# Patient Record
Sex: Male | Born: 1965 | Race: Black or African American | Hispanic: No | Smoking: Current every day smoker
Health system: Southern US, Community
[De-identification: ages and names within clinical notes are randomized; demographics above are authoritative.]

## PROBLEM LIST (undated history)

## (undated) DIAGNOSIS — I10 Essential (primary) hypertension: Secondary | ICD-10-CM

---

## 2013-11-27 ENCOUNTER — Ambulatory Visit: Payer: Self-pay | Attending: Internal Medicine

## 2013-12-02 ENCOUNTER — Encounter: Payer: Self-pay | Admitting: Internal Medicine

## 2013-12-02 ENCOUNTER — Other Ambulatory Visit: Payer: Self-pay | Admitting: Emergency Medicine

## 2013-12-02 ENCOUNTER — Ambulatory Visit: Payer: Self-pay | Attending: Internal Medicine | Admitting: Internal Medicine

## 2013-12-02 VITALS — BP 168/106 | HR 71

## 2013-12-02 DIAGNOSIS — D369 Benign neoplasm, unspecified site: Secondary | ICD-10-CM

## 2013-12-02 DIAGNOSIS — I10 Essential (primary) hypertension: Secondary | ICD-10-CM

## 2013-12-02 DIAGNOSIS — I160 Hypertensive urgency: Secondary | ICD-10-CM

## 2013-12-02 DIAGNOSIS — K029 Dental caries, unspecified: Secondary | ICD-10-CM | POA: Insufficient documentation

## 2013-12-02 DIAGNOSIS — K047 Periapical abscess without sinus: Secondary | ICD-10-CM | POA: Insufficient documentation

## 2013-12-02 DIAGNOSIS — K044 Acute apical periodontitis of pulpal origin: Secondary | ICD-10-CM

## 2013-12-02 LAB — LIPID PANEL
Cholesterol: 188 mg/dL (ref 0–200)
HDL: 31 mg/dL — ABNORMAL LOW (ref >39)
LDL CALC: 117 mg/dL — AB (ref 0–99)
Total CHOL/HDL Ratio: 6.1 Ratio
Triglycerides: 199 mg/dL — ABNORMAL HIGH (ref <150)
VLDL: 40 mg/dL (ref 0–40)

## 2013-12-02 LAB — CBC WITH DIFFERENTIAL/PLATELET
Basophils Absolute: 0 10*3/uL (ref 0.0–0.1)
Basophils Relative: 0 % (ref 0–1)
EOS PCT: 4 % (ref 0–5)
Eosinophils Absolute: 0.3 10*3/uL (ref 0.0–0.7)
HCT: 42.1 % (ref 39.0–52.0)
HEMOGLOBIN: 14.4 g/dL (ref 13.0–17.0)
LYMPHS ABS: 2.7 10*3/uL (ref 0.7–4.0)
LYMPHS PCT: 39 % (ref 12–46)
MCH: 28.7 pg (ref 26.0–34.0)
MCHC: 34.2 g/dL (ref 30.0–36.0)
MCV: 84 fL (ref 78.0–100.0)
Monocytes Absolute: 0.3 10*3/uL (ref 0.1–1.0)
Monocytes Relative: 5 % (ref 3–12)
Neutro Abs: 3.5 10*3/uL (ref 1.7–7.7)
Neutrophils Relative %: 52 % (ref 43–77)
PLATELETS: 286 10*3/uL (ref 150–400)
RBC: 5.01 MIL/uL (ref 4.22–5.81)
RDW: 14.2 % (ref 11.5–15.5)
WBC: 6.8 10*3/uL (ref 4.0–10.5)

## 2013-12-02 LAB — COMPLETE METABOLIC PANEL WITH GFR
ALT: 45 U/L (ref 0–53)
AST: 30 U/L (ref 0–37)
Albumin: 3.9 g/dL (ref 3.5–5.2)
Alkaline Phosphatase: 77 U/L (ref 39–117)
BUN: 10 mg/dL (ref 6–23)
CALCIUM: 9.4 mg/dL (ref 8.4–10.5)
CHLORIDE: 105 meq/L (ref 96–112)
CO2: 25 mEq/L (ref 19–32)
Creat: 0.81 mg/dL (ref 0.50–1.35)
GFR, Est African American: >89 mL/min
Glucose, Bld: 85 mg/dL (ref 70–99)
Potassium: 4 mEq/L (ref 3.5–5.3)
SODIUM: 139 meq/L (ref 135–145)
Total Bilirubin: 0.3 mg/dL (ref 0.2–1.2)
Total Protein: 7.5 g/dL (ref 6.0–8.3)

## 2013-12-02 MED ORDER — CLINDAMYCIN HCL 300 MG PO CAPS: 300.0000 mg | ORAL_CAPSULE | Freq: Three times a day (TID) | ORAL | Status: DC

## 2013-12-02 MED ORDER — CLONIDINE HCL 0.1 MG PO TABS
0.1000 mg | ORAL_TABLET | Freq: Once | ORAL | Status: AC
  Administered 2013-12-02: 0.1 mg via ORAL

## 2013-12-02 MED ORDER — CLONIDINE HCL 0.1 MG PO TABS: 0.1000 mg | ORAL_TABLET | Freq: Once | ORAL | Status: DC

## 2013-12-02 MED ORDER — IBUPROFEN 600 MG PO TABS: 600.0000 mg | ORAL_TABLET | Freq: Three times a day (TID) | ORAL | Status: DC | PRN

## 2013-12-02 MED ORDER — HYDROCHLOROTHIAZIDE 25 MG PO TABS: 25.0000 mg | ORAL_TABLET | Freq: Every day | ORAL | Status: DC

## 2013-12-02 NOTE — Patient Instructions (Signed)
Dental Caries  Dental caries (also called tooth decay) is the most common oral disease. It can occur at any age, but is more common in children and young adults.  HOW DENTAL CARIES DEVELOPS  The process of decay begins when bacteria and foods (particularly sugars and starches) combine in your mouth to produce plaque. Plaque is a substance that sticks to the hard, outer surface of a tooth (enamel). The bacteria in plaque produce acids that attack enamel. These acids may also attack the root surface of a tooth (cementum) if it is exposed. Repeated attacks dissolve these surfaces and create holes in the tooth (cavities). If left untreated, the acids destroy the other layers of the tooth.  RISK FACTORS  Frequent sipping of sugary beverages.   Frequent snacking on sugary and starchy foods, especially those that easily get stuck in the teeth.   Poor oral hygiene.   Dry mouth.   Substance abuse such as methamphetamine abuse.   Broken or poor-fitting dental restorations.   Eating disorders.   Gastroesophageal reflux disease (GERD).   Certain radiation treatments to the head and neck. SYMPTOMS In the early stages of dental caries, symptoms are seldom present. Sometimes white, chalky areas may be seen on the enamel or other tooth layers. In later stages, symptoms may include:  Pits and holes on the enamel.  Toothache after sweet, hot, or cold foods or drinks are consumed.  Pain around the tooth.  Swelling around the tooth. DIAGNOSIS  Most of the time, dental caries is detected during a regular dental checkup. A diagnosis is made after a thorough medical and dental history is taken and the surfaces of your teeth are checked for signs of dental caries. Sometimes special instruments, such as lasers, are used to check for dental caries. Dental X-ray exams may be taken so that areas not visible to the eye (such as between the contact areas of the teeth) can be checked for cavities.    TREATMENT  If dental caries is in its early stages, it may be reversed with a fluoride treatment or an application of a remineralizing agent at the dental office. Thorough brushing and flossing at home is needed to aid these treatments. If it is in its later stages, treatment depends on the location and extent of tooth destruction:   If a small area of the tooth has been destroyed, the destroyed area will be removed and cavities will be filled with a material such as gold, silver amalgam, or composite resin.   If a large area of the tooth has been destroyed, the destroyed area will be removed and a cap (crown) will be fitted over the remaining tooth structure.   If the center part of the tooth (pulp) is affected, a procedure called a root canal will be needed before a filling or crown can be placed.   If most of the tooth has been destroyed, the tooth may need to be pulled (extracted). HOME CARE INSTRUCTIONS You can prevent, stop, or reverse dental caries at home by practicing good oral hygiene. Good oral hygiene includes:  Thoroughly cleaning your teeth at least twice a day with a toothbrush and dental floss.   Using a fluoride toothpaste. A fluoride mouth rinse may also be used if recommended by your dentist or health care provider.   Restricting the amount of sugary and starchy foods and sugary liquids you consume.   Avoiding frequent snacking on these foods and sipping of these liquids.   Keeping regular visits  with a dentist for checkups and cleanings. PREVENTION   Practice good oral hygiene.  Consider a dental sealant. A dental sealant is a coating material that is applied by your dentist to the pits and grooves of teeth. The sealant prevents food from being trapped in them. It may protect the teeth for several years.  Ask about fluoride supplements if you live in a community without fluorinated water or with water that has a low fluoride content. Use fluoride supplements  as directed by your dentist or health care provider.  Allow fluoride varnish applications to teeth if directed by your dentist or health care provider. Document Released: 06/17/2002 Document Revised: 05/28/2013 Document Reviewed: 09/27/2012 [facility] Patient Information 2014 [place]. Hypertension As your heart beats, it forces blood through your arteries. This force is your blood pressure. If the pressure is too high, it is called hypertension (HTN) or high blood pressure. HTN is dangerous because you may have it and not know it. High blood pressure may mean that your heart has to work harder to pump blood. Your arteries may be narrow or stiff. The extra work puts you at risk for heart disease, stroke, and other problems.  Blood pressure consists of two numbers, a higher number over a lower, 110/72, for example. It is stated as "110 over 72." The ideal is below 120 for the top number (systolic) and under 80 for the bottom (diastolic). Write down your blood pressure today. You should pay close attention to your blood pressure if you have certain conditions such as:  Heart failure.  Prior heart attack.  Diabetes  Chronic kidney disease.  Prior stroke.  Multiple risk factors for heart disease. To see if you have HTN, your blood pressure should be measured while you are seated with your arm held at the level of the heart. It should be measured at least twice. A one-time elevated blood pressure reading (especially in the Emergency Department) does not mean that you need treatment. There may be conditions in which the blood pressure is different between your right and left arms. It is important to see your caregiver soon for a recheck. Most people have essential hypertension which means that there is not a specific cause. This type of high blood pressure may be lowered by changing lifestyle factors such as:  Stress.  Smoking.  Lack of exercise.  Excessive  weight.  Drug/tobacco/alcohol use.  Eating less salt. Most people do not have symptoms from high blood pressure until it has caused damage to the body. Effective treatment can often prevent, delay or reduce that damage. TREATMENT  When a cause has been identified, treatment for high blood pressure is directed at the cause. There are a large number of medications to treat HTN. These fall into several categories, and your caregiver will help you select the medicines that are best for you. Medications may have side effects. You should review side effects with your caregiver. If your blood pressure stays high after you have made lifestyle changes or started on medicines,   Your medication(s) may need to be changed.  Other problems may need to be addressed.  Be certain you understand your prescriptions, and know how and when to take your medicine.  Be sure to follow up with your caregiver within the time frame advised (usually within two weeks) to have your blood pressure rechecked and to review your medications.  If you are taking more than one medicine to lower your blood pressure, make sure you know how  and at what times they should be taken. Taking two medicines at the same time can result in blood pressure that is too low. SEEK IMMEDIATE MEDICAL CARE IF:  You develop a severe headache, blurred or changing vision, or confusion.  You have unusual weakness or numbness, or a faint feeling.  You have severe chest or abdominal pain, vomiting, or breathing problems. MAKE SURE YOU:   Understand these instructions.  Will watch your condition.  Will get help right away if you are not doing well or get worse. Document Released: 09/25/2005 Document Revised: 12/18/2011 Document Reviewed: 05/15/2008 [facility] Patient Information 2014 [place].

## 2013-12-02 NOTE — Progress Notes (Signed)
Patient ID: Seth Mcdaniel, male   DOB: 15-Apr-1966, 48 y.o.   MRN: [id number]   Seth Mcdaniel, is a 48 y.o. male  [id number]  [id number]  DOB - 15-Aug-1966  CC: No chief complaint on file.      HPI: Seth Mcdaniel is a 48 y.o. male here today to establish medical care. Patient is here for referral to dentistry for what he thinks is an infection of his tooth. He has had multiple tooth extractions due to similar infections in the past. He has history of hypertension but has not been taking medication for over 2 years. He also complained of some bumps in his eyes on the corners. These have been present for long time but slowly gradually increasing in size. No pain associated, no redness, no discharge. Patient smokes heavily, one pack of cigarette per day, drinks alcohol heavily. Denies the use of illicit drugs. Patient has No headache, No chest pain, No abdominal pain - No Nausea, No new weakness tingling or numbness, No Cough - SOB.  Allergies  Allergen Reactions  . Penicillins Hives   No past medical history on file. No current outpatient prescriptions on file prior to visit.   No current facility-administered medications on file prior to visit.   No family history on file. History   Social History  . Marital Status: Unknown    Spouse Name: N/A    Number of Children: N/A  . Years of Education: N/A   Occupational History  . Not on file.   Social History Main Topics  . Smoking status: Current Every Day Smoker -- 1.00 packs/day    Types: Cigarettes  . Smokeless tobacco: Former Systems developer  . Alcohol Use: Not on file  . Drug Use: Not on file  . Sexual Activity: Not on file   Other Topics Concern  . Not on file   Social History Narrative  . No narrative on file    Review of Systems: Constitutional: Negative for fever, chills, diaphoresis, activity change, appetite change and fatigue. HENT: Negative for ear pain, nosebleeds, congestion, facial swelling,  rhinorrhea, neck pain, neck stiffness and ear discharge.  Eyes: Negative for pain, discharge, redness, itching and visual disturbance. Respiratory: Negative for cough, choking, chest tightness, shortness of breath, wheezing and stridor.  Cardiovascular: Negative for chest pain, palpitations and leg swelling. Gastrointestinal: Negative for abdominal distention. Genitourinary: Negative for dysuria, urgency, frequency, hematuria, flank pain, decreased urine volume, difficulty urinating and dyspareunia.  Musculoskeletal: Negative for back pain, joint swelling, arthralgia and gait problem. Neurological: Negative for dizziness, tremors, seizures, syncope, facial asymmetry, speech difficulty, weakness, light-headedness, numbness and headaches.  Hematological: Negative for adenopathy. Does not bruise/bleed easily. Psychiatric/Behavioral: Negative for hallucinations, behavioral problems, confusion, dysphoric mood, decreased concentration and agitation.    Objective:   Filed Vitals:   12/02/13 1559  BP: 169/112  Pulse: 78    Physical Exam: Constitutional: Patient appears well-developed and well-nourished. No distress. Cigarette stench HENT: Normocephalic, atraumatic, External right and left ear normal. Oropharynx is clear and moist. Multiple dental caries but pulp infections and cracks. Eyes: Bilateral cystic looking swellings in the lateral angle of both eyes. Conjunctivae and EOM are normal. PERRLA, no scleral icterus. Neck: Normal ROM. Neck supple. No JVD. No tracheal deviation. No thyromegaly. CVS: RRR, S1/S2 +, no murmurs, no gallops, no carotid bruit.  Pulmonary: Effort and breath sounds normal, no stridor, rhonchi, wheezes, rales.  Abdominal: Soft. BS +, no distension, tenderness, rebound or guarding.  Musculoskeletal: Normal range of motion. No edema  and no tenderness.  Lymphadenopathy: No lymphadenopathy noted, cervical, inguinal or axillary Neuro: Alert. Normal reflexes, muscle tone  coordination. No cranial nerve deficit. Skin: Skin is warm and dry. No rash noted. Not diaphoretic. No erythema. No pallor. Psychiatric: Normal mood and affect. Behavior, judgment, thought content normal.  No results found for this basename: WBC, HGB, HCT, MCV, PLT   No results found for this basename: CREATININE, BUN, NA, K, CL, CO2    No results found for this basename: HGBA1C   Lipid Panel  No results found for this basename: chol, trig, hdl, cholhdl, vldl, ldlcalc       Assessment and plan:   1. Dental caries extending into pulp  - Ambulatory referral to Dentistry - ibuprofen (ADVIL,MOTRIN) 600 MG tablet; Take 1 tablet (600 mg total) by mouth every 8 (eight) hours as needed.  Dispense: 30 tablet; Refill: 0  2. Tooth infection Patient is allergic to penicillin - clindamycin (CLEOCIN) 300 MG capsule; Take 1 capsule (300 mg total) by mouth 3 (three) times daily.  Dispense: 15 capsule; Refill: 0 - Ambulatory referral to Dentistry - ibuprofen (ADVIL,MOTRIN) 600 MG tablet; Take 1 tablet (600 mg total) by mouth every 8 (eight) hours as needed.  Dispense: 30 tablet; Refill: 0  3. Hypertensive urgency  - hydrochlorothiazide (HYDRODIURIL) 25 MG tablet; Take 1 tablet (25 mg total) by mouth daily.  Dispense: 90 tablet; Refill: 3 - CBC with Differential - COMPLETE METABOLIC PANEL WITH GFR - POCT glycosylated hemoglobin (Hb A1C) - Lipid panel - Urinalysis, Complete  4. Oncocytoma  - Ambulatory referral to Ophthalmology  Patient was counseled extensively on smoking cessation Patient was counseled extensively on nutrition and exercise  Return in about 4 weeks (around 12/30/2013), or if symptoms worsen or fail to improve, for BP Check.  The patient was given clear instructions to go to ER or return to medical center if symptoms don't improve, worsen or new problems develop. The patient verbalized understanding. The patient was told to call to get lab results if they haven't heard  anything in the next week.     Angelica Chessman, MD, [place], [place], [place], [place]   12/02/2013, 4:23 PM

## 2013-12-03 LAB — URINALYSIS, COMPLETE
Bacteria, UA: NONE SEEN
CASTS: NONE SEEN
CRYSTALS: NONE SEEN
Glucose, UA: NEGATIVE mg/dL
Hgb urine dipstick: NEGATIVE
Ketones, ur: NEGATIVE mg/dL
LEUKOCYTES UA: NEGATIVE
NITRITE: NEGATIVE
Protein, ur: NEGATIVE mg/dL
SPECIFIC GRAVITY, URINE: 1.016 (ref 1.005–1.030)
SQUAMOUS EPITHELIAL / LPF: NONE SEEN
Urobilinogen, UA: 0.2 mg/dL (ref 0.0–1.0)
pH: 5.5 (ref 5.0–8.0)

## 2013-12-08 ENCOUNTER — Telehealth: Payer: Self-pay | Admitting: *Deleted

## 2013-12-08 NOTE — Telephone Encounter (Signed)
Message copied by Joan Mayans on Mon Dec 08, 2013  9:19 AM ------      Message from: Angelica Chessman E      Created: Thu Dec 04, 2013  5:12 PM       Please inform patient that his laboratory results are mostly within normal limit except for slightly elevated cholesterol level. At this time we will recommend nutritional control of low cholesterol low fat diet as well as regular physical exercise of 3 times a week 30 minutes each time ------

## 2013-12-08 NOTE — Telephone Encounter (Signed)
I spoke to the pt and informed him of his lab results  

## 2013-12-19 ENCOUNTER — Ambulatory Visit: Payer: Self-pay | Admitting: Internal Medicine

## 2014-04-16 ENCOUNTER — Ambulatory Visit: Payer: Self-pay | Admitting: Internal Medicine

## 2015-07-12 ENCOUNTER — Emergency Department (HOSPITAL_COMMUNITY)
Admission: EM | Admit: 2015-07-12 | Discharge: 2015-07-12 | Disposition: A | Discharge status: Home / Self Care | Payer: Self-pay | Attending: Emergency Medicine | Admitting: Emergency Medicine

## 2015-07-12 ENCOUNTER — Encounter (HOSPITAL_COMMUNITY): Payer: Self-pay | Admitting: Family Medicine

## 2015-07-12 DIAGNOSIS — K0889 Other specified disorders of teeth and supporting structures: Secondary | ICD-10-CM | POA: Insufficient documentation

## 2015-07-12 DIAGNOSIS — K029 Dental caries, unspecified: Secondary | ICD-10-CM | POA: Insufficient documentation

## 2015-07-12 DIAGNOSIS — Z720 Tobacco use: Secondary | ICD-10-CM | POA: Insufficient documentation

## 2015-07-12 DIAGNOSIS — R5383 Other fatigue: Secondary | ICD-10-CM | POA: Insufficient documentation

## 2015-07-12 DIAGNOSIS — I10 Essential (primary) hypertension: Secondary | ICD-10-CM | POA: Insufficient documentation

## 2015-07-12 DIAGNOSIS — K047 Periapical abscess without sinus: Secondary | ICD-10-CM

## 2015-07-12 DIAGNOSIS — Z880 Allergy status to penicillin: Secondary | ICD-10-CM | POA: Insufficient documentation

## 2015-07-12 DIAGNOSIS — I160 Hypertensive urgency: Secondary | ICD-10-CM

## 2015-07-12 HISTORY — DX: Essential (primary) hypertension: I10

## 2015-07-12 LAB — CBC WITH DIFFERENTIAL/PLATELET
Basophils Absolute: 0 10*3/uL (ref 0.0–0.1)
Basophils Relative: 0 %
Eosinophils Absolute: 0.1 10*3/uL (ref 0.0–0.7)
Eosinophils Relative: 2 %
HEMATOCRIT: 44.6 % (ref 39.0–52.0)
Hemoglobin: 15.1 g/dL (ref 13.0–17.0)
LYMPHS PCT: 36 %
Lymphs Abs: 2.5 10*3/uL (ref 0.7–4.0)
MCH: 29.6 pg (ref 26.0–34.0)
MCHC: 33.9 g/dL (ref 30.0–36.0)
MCV: 87.5 fL (ref 78.0–100.0)
MONO ABS: 0.4 10*3/uL (ref 0.1–1.0)
Monocytes Relative: 6 %
NEUTROS ABS: 3.9 10*3/uL (ref 1.7–7.7)
NEUTROS PCT: 56 %
Platelets: 249 10*3/uL (ref 150–400)
RBC: 5.1 MIL/uL (ref 4.22–5.81)
RDW: 13.8 % (ref 11.5–15.5)
WBC: 6.9 10*3/uL (ref 4.0–10.5)

## 2015-07-12 LAB — BASIC METABOLIC PANEL
Anion gap: 8 (ref 5–15)
BUN: 8 mg/dL (ref 6–20)
CALCIUM: 9.3 mg/dL (ref 8.9–10.3)
CHLORIDE: 102 mmol/L (ref 101–111)
CO2: 24 mmol/L (ref 22–32)
Creatinine, Ser: 0.72 mg/dL (ref 0.61–1.24)
GFR calc Af Amer: >60 mL/min (ref >60)
GFR calc non Af Amer: >60 mL/min (ref >60)
Glucose, Bld: 98 mg/dL (ref 65–99)
POTASSIUM: 3.5 mmol/L (ref 3.5–5.1)
Sodium: 134 mmol/L — ABNORMAL LOW (ref 135–145)

## 2015-07-12 MED ORDER — HYDROCHLOROTHIAZIDE 12.5 MG PO CAPS
25.0000 mg | ORAL_CAPSULE | Freq: Once | ORAL | Status: AC
  Administered 2015-07-12: 25 mg via ORAL
  Filled 2015-07-12: qty 2

## 2015-07-12 MED ORDER — CLINDAMYCIN HCL 300 MG PO CAPS: 300.0000 mg | ORAL_CAPSULE | Freq: Three times a day (TID) | ORAL | Status: DC

## 2015-07-12 MED ORDER — BUPIVACAINE-EPINEPHRINE (PF) 0.5% -1:200000 IJ SOLN
1.8000 mL | Freq: Once | INTRAMUSCULAR | Status: AC
  Administered 2015-07-12: 1.8 mL
  Filled 2015-07-12: qty 1.8

## 2015-07-12 MED ORDER — HYDROCODONE-ACETAMINOPHEN 5-325 MG PO TABS: 1.0000 | ORAL_TABLET | Freq: Four times a day (QID) | ORAL | Status: DC | PRN

## 2015-07-12 MED ORDER — CLINDAMYCIN HCL 300 MG PO CAPS
300.0000 mg | ORAL_CAPSULE | Freq: Once | ORAL | Status: AC
  Administered 2015-07-12: 300 mg via ORAL
  Filled 2015-07-12: qty 1

## 2015-07-12 MED ORDER — LIDOCAINE-EPINEPHRINE 1 %-1:100000 IJ SOLN: 10.0000 mL | Freq: Once | INTRAMUSCULAR | Status: DC

## 2015-07-12 MED ORDER — HYDROCHLOROTHIAZIDE 25 MG PO TABS: 25.0000 mg | ORAL_TABLET | Freq: Every day | ORAL | Status: DC

## 2015-07-12 NOTE — ED Notes (Signed)
Patients states he is experiencing upper right dental pain for the last 3 weeks. Also, has hypertension. He was taking BP medication til he about 8-9 months ago. Pt never got another prescription.

## 2015-07-12 NOTE — ED Provider Notes (Signed)
CSN: [id number]     Arrival date & time 07/12/15  0617 History   First MD Initiated Contact with Patient 07/12/15 [phone]     Chief Complaint  Patient presents with  . Dental Pain  . Hypertension     (Consider location/radiation/quality/duration/timing/severity/associated sxs/prior Treatment) HPI Comments: 49 y.o. Male with history of HTN presents for right upper tooth pain.  Patient reports that he has had a broken tooth for a few weeks but since last night the pain has been increasing and it was so unbearable that he could not sleep.  He says he has been feeling over all okay but more fatigued than usual.  He also reports that he had been feeling well and stopped his blood pressure medications at home.  He has not seen a physician or rechecked his BP since stopping his medications.  He denies chest pain, shortness of breath, leg swelling, headache, neurologic deficit or weakness.  He said he really just came in for his tooth but on arrival was found to be hypertensive.    Patient is a 49 y.o. male presenting with tooth pain and hypertension.  Dental Pain Associated symptoms: no congestion, no drooling, no facial swelling, no fever, no headaches and no oral lesions   Hypertension Pertinent negatives include no chest pain, no abdominal pain, no headaches and no shortness of breath.    Past Medical History  Diagnosis Date  . Hypertension    History reviewed. No pertinent past surgical history. History reviewed. No pertinent family history. Social History  Substance Use Topics  . Smoking status: Current Every Day Smoker -- 1.00 packs/day    Types: Cigarettes  . Smokeless tobacco: Former Systems developer  . Alcohol Use: No    Review of Systems  Constitutional: Positive for fatigue. Negative for fever, chills and appetite change.  HENT: Positive for dental problem. Negative for congestion, drooling, facial swelling, mouth sores, postnasal drip, rhinorrhea, sinus pressure, trouble swallowing and  voice change.   Eyes: Negative for pain and visual disturbance.  Respiratory: Negative for cough, chest tightness and shortness of breath.   Cardiovascular: Negative for chest pain and palpitations.  Gastrointestinal: Negative for nausea, vomiting, abdominal pain, diarrhea and constipation.  Genitourinary: Negative for dysuria, urgency, hematuria and decreased urine volume.  Musculoskeletal: Negative for myalgias and back pain.  Skin: Negative for wound.  Neurological: Negative for dizziness, weakness, light-headedness, numbness and headaches.  Hematological: Does not bruise/bleed easily.      Allergies  Penicillins  Home Medications   Prior to Admission medications   Medication Sig Start Date End Date Taking? Authorizing Provider  clindamycin (CLEOCIN) 300 MG capsule Take 1 capsule (300 mg total) by mouth 3 (three) times daily. 07/12/15   Harvel Quale, MD  hydrochlorothiazide (HYDRODIURIL) 25 MG tablet Take 1 tablet (25 mg total) by mouth daily. 07/12/15   Harvel Quale, MD  HYDROcodone-acetaminophen (NORCO) 5-325 MG tablet Take 1-2 tablets by mouth every 6 (six) hours as needed for moderate pain. 07/12/15   Harvel Quale, MD  ibuprofen (ADVIL,MOTRIN) 600 MG tablet Take 1 tablet (600 mg total) by mouth every 8 (eight) hours as needed. Patient not taking: Reported on 07/12/2015 12/02/13   Tresa Garter, MD   BP 190/111 mmHg  Pulse 59  Temp(Src) 97.7 F (36.5 C) (Oral)  Resp 16  Ht 6\' 3"  (1.905 m)  Wt 240 lb (108.863 kg)  BMI 30.00 kg/m2  SpO2 98% Physical Exam  Constitutional: He is oriented to person, place, and  time. He appears well-developed and well-nourished. No distress.  HENT:  Head: Normocephalic and atraumatic.  Right Ear: External ear normal.  Left Ear: External ear normal.  Mouth/Throat: Uvula is midline and oropharynx is clear and moist. No trismus in the jaw. Abnormal dentition. Dental caries (extensive) present. No dental abscesses. No posterior  oropharyngeal edema, posterior oropharyngeal erythema or tonsillar abscesses.    Eyes: EOM are normal. Pupils are equal, round, and reactive to light.  Neck: Normal range of motion. Neck supple.  Cardiovascular: Normal rate, regular rhythm, normal heart sounds and intact distal pulses.   No murmur heard. Pulmonary/Chest: Effort normal. No respiratory distress. He has no wheezes. He has no rales.  Abdominal: Soft. He exhibits no distension. There is no tenderness.  Musculoskeletal: Normal range of motion. He exhibits no edema or tenderness.  Neurological: He is alert and oriented to person, place, and time. No cranial nerve deficit or sensory deficit. He exhibits normal muscle tone. Coordination normal.  Skin: Skin is warm and dry. No rash noted. He is not diaphoretic.  Vitals reviewed.   ED Course  NERVE BLOCK Date/Time: 07/12/2015 8:14 AM Performed by: Lonia Skinner ROE Authorized by: Harvel Quale Consent: Verbal consent obtained. Risks and benefits: risks, benefits and alternatives were discussed Consent given by: patient Patient understanding: patient states understanding of the procedure being performed Required items: required blood products, implants, devices, and special equipment available Patient identity confirmed: verbally with patient Indications: pain relief Body area: face/mouth Nerve: infraorbital Laterality: right Patient sedated: no Patient position: sitting Needle gauge: 59 G Location technique: anatomical landmarks Local anesthetic: bupivacaine 0.5% without epinephrine Anesthetic total: 1.8 ml Outcome: pain improved Patient tolerance: Patient tolerated the procedure well with no immediate complications   (including critical care time) Labs Review Labs Reviewed  BASIC METABOLIC PANEL - Abnormal; Notable for the following:    Sodium 134 (*)    All other components within normal limits  CBC WITH DIFFERENTIAL/PLATELET    Imaging Review No results  found. I have personally reviewed and evaluated these images and lab results as part of my medical decision-making.   EKG Interpretation   Date/Time:  Monday July 12 2015 07:25:51 EDT Ventricular Rate:  59 PR Interval:  162 QRS Duration: 93 QT Interval:  449 QTC Calculation: 445 R Axis:   -7 Text Interpretation:  Sinus rhythm Left ventricular hypertrophy Early  repolarization pattern No previous ECGs available Confirmed by Tinea Nobile,  Alek Borges ([id number]) on 07/12/2015 7:32:27 AM      MDM  Patient seen and evaluated in stable condition.  Patient with asymptomatic hypertension.  No dental abscess but poor dentition with broken tooth which is most painful.  Dental block completed as detailed above with good response.  Blood work and EKG unremarkable.  Patient with normal cardiac and neurologic examinations.  Patient was given a first dose of his hydrochlorothiazide which he was supposed to be taking outpatient.  He was discharged home in stable condition with strict return precautions, prescriptions for Clindamycin and  Norco and HCTZ, as well as instruction to follow up with internal medicine and a dentist outpatient.  Patient and wife expressed understanding and agreement with plan of care.  All questions answered prior to discharge. Final diagnoses:  Uncontrolled hypertension  Pain, dental    1. Dental fracture 2. Dental pain  3. Uncontrolled hypertension, asymptomatic    Harvel Quale, MD 07/12/15 [phone]

## 2015-07-12 NOTE — ED Notes (Signed)
Report given to Annastyn Silvey RN. Care relinquished

## 2015-07-12 NOTE — Discharge Instructions (Signed)
Hypertension °Hypertension, commonly called high blood pressure, is when the force of blood pumping through your arteries is too strong. Your arteries are the blood vessels that carry blood from your heart throughout your body. A blood pressure reading consists of a higher number over a lower number, such as 110/72. The higher number (systolic) is the pressure inside your arteries when your heart pumps. The lower number (diastolic) is the pressure inside your arteries when your heart relaxes. Ideally you want your blood pressure below 120/80. °Hypertension forces your heart to work harder to pump blood. Your arteries may become narrow or stiff. Having hypertension puts you at risk for heart disease, stroke, and other problems.  °RISK FACTORS °Some risk factors for high blood pressure are controllable. Others are not.  °Risk factors you cannot control include:  °· Race. You may be at higher risk if you are African American. °· Age. Risk increases with age. °· Gender. Men are at higher risk than women before age 45 years. After age 65, women are at higher risk than men. °Risk factors you can control include: °· Not getting enough exercise or physical activity. °· Being overweight. °· Getting too much fat, sugar, calories, or salt in your diet. °· Drinking too much alcohol. °SIGNS AND SYMPTOMS °Hypertension does not usually cause signs or symptoms. Extremely high blood pressure (hypertensive crisis) may cause headache, anxiety, shortness of breath, and nosebleed. °DIAGNOSIS  °To check if you have hypertension, your health care provider will measure your blood pressure while you are seated, with your arm held at the level of your heart. It should be measured at least twice using the same arm. Certain conditions can cause a difference in blood pressure between your right and left arms. A blood pressure reading that is higher than normal on one occasion does not mean that you need treatment. If one blood pressure reading  is high, ask your health care provider about having it checked again. °TREATMENT  °Treating high blood pressure includes making lifestyle changes and possibly taking medicine. Living a healthy lifestyle can help lower high blood pressure. You may need to change some of your habits. °Lifestyle changes may include: °· Following the DASH diet. This diet is high in fruits, vegetables, and whole grains. It is low in salt, red meat, and added sugars. °· Getting at least 2½ hours of brisk physical activity every week. °· Losing weight if necessary. °· Not smoking. °· Limiting alcoholic beverages. °· Learning ways to reduce stress. ° If lifestyle changes are not enough to get your blood pressure under control, your health care provider may prescribe medicine. You may need to take more than one. Work closely with your health care provider to understand the risks and benefits. °HOME CARE INSTRUCTIONS °· Have your blood pressure rechecked as directed by your health care provider.   °· Take medicines only as directed by your health care provider. Follow the directions carefully. Blood pressure medicines must be taken as prescribed. The medicine does not work as well when you skip doses. Skipping doses also puts you at risk for problems.   °· Do not smoke.   °· Monitor your blood pressure at home as directed by your health care provider.  °SEEK MEDICAL CARE IF:  °· You think you are having a reaction to medicines taken. °· You have recurrent headaches or feel dizzy. °· You have swelling in your ankles. °· You have trouble with your vision. °SEEK IMMEDIATE MEDICAL CARE IF: °· You develop a severe headache or confusion. °·   You have unusual weakness, numbness, or feel faint.  You have severe chest or abdominal pain.  You vomit repeatedly.  You have trouble breathing. MAKE SURE YOU:   Understand these instructions.  Will watch your condition.  Will get help right away if you are not doing well or get worse. Document  Released: 09/25/2005 Document Revised: 02/09/2014 Document Reviewed: 07/18/2013 [facility] Patient Information 2015 [place], [place]. This information is not intended to replace advice given to you by your health care provider. Make sure you discuss any questions you have with your health care provider.  Dental Fracture (broken tooth) Your tooth is broken and causing you a great deal of pain.  It also looks as though you have inflammation of your gums concerning for infection but no sign of abscess or pocket of infection.  You need to follow up with a dentist. You have a dental fracture or injury. This can mean the tooth is loose, has a chip in the enamel or is broken. If just the outer enamel is chipped, there is a good chance the tooth will not become infected. The only treatment needed may be to smooth off a rough edge. Fractures into the deeper layers (dentin and pulp) cause greater pain and are more likely to become infected. These require you to see a dentist as soon as possible to save the tooth. Loose teeth may need to be wired or bonded with a plastic splint to hold them in place. A paste may be painted on the open area of the broken tooth to reduce the pain. Antibiotics and pain medicine may be prescribed. Choosing a soft or liquid diet and rinsing the mouth out with warm water after meals may be helpful. See your dentist as recommended. Failure to seek care or follow up with a dentist or other specialist as recommended could result in the loss of your tooth, infection, or permanent dental problems. SEEK MEDICAL CARE IF:   You have increased pain not controlled with medicines.  You have swelling around the tooth, in the face or neck.  You have bleeding which starts, continues, or gets worse.  You have a fever. Document Released: 11/02/2004 Document Revised: 12/18/2011 Document Reviewed: 08/17/2009 [facility] Patient Information 2015 [place], [place]. This information is not intended to replace  advice given to you by your health care provider. Make sure you discuss any questions you have with your health care provider.   Emergency Department Resource Guide 1) Find a Doctor and Pay Out of Pocket Although you won't have to find out who is covered by your insurance plan, it is a good idea to ask around and get recommendations. You will then need to call the office and see if the doctor you have chosen will accept you as a new patient and what types of options they offer for patients who are self-pay. Some doctors offer discounts or will set up payment plans for their patients who do not have insurance, but you will need to ask so you aren't surprised when you get to your appointment.  2) Contact Your Local Health Department Not all health departments have doctors that can see patients for sick visits, but many do, so it is worth a call to see if yours does. If you don't know where your local health department is, you can check in your phone book. The CDC also has a tool to help you locate your state's health department, and many state websites also have listings of all of their local  health departments.  3) Find a [facility] If your illness is not likely to be very severe or complicated, you may want to try a walk in clinic. These are popping up all over the country in pharmacies, drugstores, and shopping centers. They're usually staffed by nurse practitioners or physician assistants that have been trained to treat common illnesses and complaints. They're usually fairly quick and inexpensive. However, if you have serious medical issues or chronic medical problems, these are probably not your best option.  No Primary Care Doctor: - Call Health Connect at  [phone] - they can help you locate a primary care doctor that  accepts your insurance, provides certain services, etc. - Physician Referral Service- [phone]  Chronic Pain Problems: Organization         Address  Phone    Notes  [facility]  [phone] Patients need to be referred by their primary care doctor.   Medication Assistance: Organization         Address  Phone   Notes  [facility] Medication [facility] [address] [place], [place] [address] [phone] --Must be a resident of [facility] -- Must have NO insurance coverage whatsoever (no Medicaid/ Medicare, etc.) -- The pt. MUST have a primary care doctor that directs their care regularly and follows them in the community   MedAssist  [phone]   [employer]  [phone]    Agencies that provide inexpensive medical care: Organization         Address  Phone   Notes  [place]  [phone]   [facility]    [phone]   [facility] [place], [place] [address] [phone]   [place] [address], [place] [phone]   Planned Parenthood    [phone]   [facility]    [phone]   [place] and [place] [address], [place] Phone:  [phone], Fax:  [phone] Hours of Operation:  9 am - 6 pm, M-F.  Also accepts Medicaid/Medicare and self-pay.  [facility] for [place] [address], [address], [place] Phone: [phone], Fax: [phone]. Hours of Operation:  8:30 am - 5:30 pm, M-F.  Also accepts Medicaid and self-pay.  [facility] [address] [place] Phone: [phone]   [place], [place], [place] [phone], Ext. [phone] Mondays & Thursdays: 7-9 AM.  First 15 patients are seen on a first come, first serve basis.    [place] Providers:  Organization         Address  Phone   Notes  [facility] [address], [place] [phone] Also accepts self-pay patients.  [facility]  [address], [place]  [phone]   [place], [address], [place] [phone]   [facility] [address], [place] [phone]   Lucianne Lei [address], [place]   [phone] Only accepts [place] Access [place] patients after they have their name applied to their card.   Self-Pay (no insurance) in [facility]:  Organization         Address  Phone   Notes  Sickle Cell Patients, [employer]  Internal Medicine [place] [phone]   [facility] [phone]   [facility] [place]  [place], [address], [place] [phone]   [facility]/Dr. Osei-Bonsu  [address], [place] or [address], [place] [phone] Phone number for both [place] and [place] locations is the same.  [facility] [address], [place] [phone]   [facility] [address] [place] or [address] [phone]   [facility] [address], [place] [phone], phone; [phone], fax Sees patients 1st and 3rd Saturday of every month.  Must not qualify for public or private insurance (i.e. Medicaid, Medicare, [place] Health Choice, Veterans' Benefits)  Household income should be no more than 200% of the poverty level The clinic cannot treat you if you are pregnant or think you are pregnant  Sexually transmitted diseases are not treated at the clinic.    Dental Care: Organization         Address  Phone  Notes  [facility] Department of [facility] [place] [phone] Accepts children up to age 15 who are enrolled in [place] or [place]; pregnant women with a Medicaid card; and children who have  applied for Medicaid or [place] Health Choice, but were declined, whose parents can pay a reduced fee at time of service.  [facility] Department of [facility]  [address], [place] [phone] Accepts children up to age 91 who are enrolled in [place] or [place]; pregnant women with a Medicaid card; and children who have applied for Medicaid or [place] Health Choice, but were declined, whose parents can pay a reduced fee at time of service.  [place] Adult Dental Access PROGRAM  [facility] [phone] Patients are seen by appointment only. Walk-ins are not accepted. [place] will see patients 46 years of age and older. Monday - Tuesday (8am-5pm) Most Wednesdays (8:30-5pm) $30 per visit, cash only  [facility] Adult Dental Access PROGRAM  [address], [facility] [phone] Patients are seen by appointment only. Walk-ins are not accepted. [place] will see patients 75 years of age and older. One Wednesday Evening (Monthly: Volunteer Based).  $30 per visit, cash only  [place]  [phone] for adults; Children under age 27, call Graduate Pediatric Dentistry at [phone]. Children aged 48-14, please call [phone] to request a pediatric application.  Dental services are provided in all areas of dental care including fillings, crowns and bridges, complete and partial dentures, implants, gum treatment, root canals, and extractions. Preventive care is also provided. Treatment is provided to both adults and children. Patients are selected via a lottery and there is often a waiting list.   [facility] [address] [place]  [phone] [email or web address]   [facility] [address] [place], [place] [phone], Ext. [phone] Second and Fourth Thursday of each month, opens at 6:30 AM; Clinic ends at 9 AM.  Patients are seen on a first-come first-served basis, and a  limited number are seen during each clinic.   [facility]  [address] [place] Danker [place], [place] [phone]   Eligibility Requirements You must have  lived in [place], [place], or [place] counties for at least the last three months.   You cannot be eligible for state or federal sponsored [employer], including [employer], [place], or [employer].   You generally cannot be eligible for healthcare insurance through your employer.    How to apply: Eligibility screenings are held every Tuesday and Wednesday afternoon from 1:00 pm until 4:00 pm. You do not need an appointment for the interview!  [facility] [address], [place], [place]   [place]  [place] Department  [place]  [phone]    Behavioral Health Resources in the Community: Intensive Outpatient Programs Organization         Address  Phone  Notes  [place]. [address], [place], [place] [phone]   [facility] Outpatient [address] [place], [place]   ADS: Alcohol [facility] Drug [facility] [address] [place], [place]   [place] [address],  [place], [place] or [phone]   Substance Abuse Resources Organization         Address  Phone  Notes  Alcohol and Drug Services  [phone]   [place]  [phone]   [facility] [place]   Chinita Pester  [phone]   Residential & Outpatient Substance Abuse Program  [phone]   Psychological Services Organization         Address  Phone  Notes  [facility] [place]  [place]  [phone]   [place] [address], [place] or [phone]    Mobile Crisis Teams Organization          Address  Phone  Notes  [facility], Mobile Crisis Care Unit  [phone]   [facility]  [address] [place], [place]   [place] [address], [place] [phone]    Self-Help/Support Groups Organization         Address  Phone             Notes  [place]. of [place] - variety of support groups  [place] Call for more information  Narcotics Anonymous (NA), [facility] [address], [employer] [place]  2 meetings at this location   Special educational needs teacher         Address  Phone  Notes  [facility] [place],    [place]  [phone]   [facility]  [address] [place] [address]2947, [place], [place]   [place], [place] [phone] Admissions: 8am-3pm M-F  Incentives Substance [place] [address],    [place], [place] [phone]   [facility] [address] Jadene Pierini [place], [place]   [facility] [address],  [place], [place]   [facility]ight Programs - Intensive Outpatient [address] Krist[address], [place], [place]   [facility] ([place] [address].,  [place], [place] or [phone]   [facility] (RTS) [address] [place], [place] Accepts Medicaid  [facility] [place] [address],  [place] [phone] Substance Abuse/Addiction Treatment   [facility] Resources Organization         Address  Phone  Notes  [facility]  [phone]   Domenic Schwab, PhD [address] Arlis Porta [place], [place]   [phone] or [phone]   [place], [place] [phone]   [address], [place], [place] [phone] Insurance/Medicaid/sponsorship  through [facility] and Families [address] [place]                                    [place], [place] [phone] [place] [address], [place] [phone]    Dr. Adele Schilder  [phone]   [facility] Dept. 1) [address], [place] 2) [place] 3)  [address], [place] [phone]  [phone]   [place] [phone] or [phone] (After Hours)

## 2017-06-13 ENCOUNTER — Encounter (HOSPITAL_COMMUNITY): Payer: Self-pay | Admitting: *Deleted

## 2017-06-13 ENCOUNTER — Emergency Department (HOSPITAL_COMMUNITY)
Admission: EM | Admit: 2017-06-13 | Discharge: 2017-06-13 | Disposition: A | Discharge status: Home / Self Care | Payer: Self-pay | Attending: Emergency Medicine | Admitting: Emergency Medicine

## 2017-06-13 ENCOUNTER — Emergency Department (HOSPITAL_COMMUNITY): Payer: Self-pay

## 2017-06-13 DIAGNOSIS — R7989 Other specified abnormal findings of blood chemistry: Secondary | ICD-10-CM

## 2017-06-13 DIAGNOSIS — F17210 Nicotine dependence, cigarettes, uncomplicated: Secondary | ICD-10-CM | POA: Insufficient documentation

## 2017-06-13 DIAGNOSIS — Z720 Tobacco use: Secondary | ICD-10-CM

## 2017-06-13 DIAGNOSIS — R1012 Left upper quadrant pain: Secondary | ICD-10-CM

## 2017-06-13 DIAGNOSIS — R112 Nausea with vomiting, unspecified: Secondary | ICD-10-CM

## 2017-06-13 DIAGNOSIS — I160 Hypertensive urgency: Secondary | ICD-10-CM | POA: Insufficient documentation

## 2017-06-13 DIAGNOSIS — R3129 Other microscopic hematuria: Secondary | ICD-10-CM

## 2017-06-13 DIAGNOSIS — R778 Other specified abnormalities of plasma proteins: Secondary | ICD-10-CM

## 2017-06-13 DIAGNOSIS — K2970 Gastritis, unspecified, without bleeding: Secondary | ICD-10-CM | POA: Insufficient documentation

## 2017-06-13 DIAGNOSIS — E876 Hypokalemia: Secondary | ICD-10-CM | POA: Insufficient documentation

## 2017-06-13 DIAGNOSIS — N200 Calculus of kidney: Secondary | ICD-10-CM | POA: Insufficient documentation

## 2017-06-13 DIAGNOSIS — R9431 Abnormal electrocardiogram [ECG] [EKG]: Secondary | ICD-10-CM

## 2017-06-13 LAB — URINALYSIS, ROUTINE W REFLEX MICROSCOPIC
Bacteria, UA: NONE SEEN
Bilirubin Urine: NEGATIVE
Glucose, UA: 50 mg/dL — AB
Ketones, ur: NEGATIVE mg/dL
Leukocytes, UA: NEGATIVE
Nitrite: NEGATIVE
Protein, ur: 100 mg/dL — AB
Specific Gravity, Urine: 1.008 (ref 1.005–1.030)
Squamous Epithelial / LPF: NONE SEEN
pH: 8 (ref 5.0–8.0)

## 2017-06-13 LAB — CBC
HCT: 45.5 % (ref 39.0–52.0)
Hemoglobin: 15.7 g/dL (ref 13.0–17.0)
MCH: 29.7 pg (ref 26.0–34.0)
MCHC: 34.5 g/dL (ref 30.0–36.0)
MCV: 86.2 fL (ref 78.0–100.0)
Platelets: 266 10*3/uL (ref 150–400)
RBC: 5.28 MIL/uL (ref 4.22–5.81)
RDW: 14 % (ref 11.5–15.5)
WBC: 10.8 10*3/uL — ABNORMAL HIGH (ref 4.0–10.5)

## 2017-06-13 LAB — COMPREHENSIVE METABOLIC PANEL
ALT: 15 U/L — ABNORMAL LOW (ref 17–63)
AST: 23 U/L (ref 15–41)
Albumin: 4.5 g/dL (ref 3.5–5.0)
Alkaline Phosphatase: 95 U/L (ref 38–126)
Anion gap: 11 (ref 5–15)
BUN: 8 mg/dL (ref 6–20)
CO2: 28 mmol/L (ref 22–32)
Calcium: 9.7 mg/dL (ref 8.9–10.3)
Chloride: 97 mmol/L — ABNORMAL LOW (ref 101–111)
Creatinine, Ser: 0.83 mg/dL (ref 0.61–1.24)
GFR calc Af Amer: >60 mL/min (ref >60)
GFR calc non Af Amer: >60 mL/min (ref >60)
Glucose, Bld: 118 mg/dL — ABNORMAL HIGH (ref 65–99)
Potassium: 3.1 mmol/L — ABNORMAL LOW (ref 3.5–5.1)
Sodium: 136 mmol/L (ref 135–145)
Total Bilirubin: 0.6 mg/dL (ref 0.3–1.2)
Total Protein: 9 g/dL — ABNORMAL HIGH (ref 6.5–8.1)

## 2017-06-13 LAB — TROPONIN I
TROPONIN I: 0.04 ng/mL — AB (ref <0.03)
Troponin I: 0.04 ng/mL (ref <0.03)

## 2017-06-13 LAB — I-STAT TROPONIN, ED: Troponin i, poc: 0.01 ng/mL (ref 0.00–0.08)

## 2017-06-13 LAB — LIPASE, BLOOD: Lipase: 48 U/L (ref 11–51)

## 2017-06-13 MED ORDER — MORPHINE SULFATE (PF) 4 MG/ML IV SOLN
4.0000 mg | Freq: Once | INTRAVENOUS | Status: AC
  Administered 2017-06-13: 4 mg via INTRAVENOUS
  Filled 2017-06-13: qty 1

## 2017-06-13 MED ORDER — CLONIDINE HCL 0.1 MG PO TABS
0.1000 mg | ORAL_TABLET | Freq: Once | ORAL | Status: AC
  Administered 2017-06-13: 0.1 mg via ORAL
  Filled 2017-06-13: qty 1

## 2017-06-13 MED ORDER — FAMOTIDINE IN NACL 20-0.9 MG/50ML-% IV SOLN
20.0000 mg | Freq: Once | INTRAVENOUS | Status: AC
  Administered 2017-06-13: 20 mg via INTRAVENOUS
  Filled 2017-06-13: qty 50

## 2017-06-13 MED ORDER — RANITIDINE HCL 150 MG PO TABS: 150.0000 mg | ORAL_TABLET | Freq: Two times a day (BID) | ORAL | 0 refills | Status: DC

## 2017-06-13 MED ORDER — ONDANSETRON HCL 8 MG PO TABS: 8.0000 mg | ORAL_TABLET | Freq: Three times a day (TID) | ORAL | 0 refills | Status: DC | PRN

## 2017-06-13 MED ORDER — SODIUM CHLORIDE 0.9 % IV BOLUS (SEPSIS)
500.0000 mL | Freq: Once | INTRAVENOUS | Status: AC
  Administered 2017-06-13: 500 mL via INTRAVENOUS

## 2017-06-13 MED ORDER — HYDROCHLOROTHIAZIDE 25 MG PO TABS: 25.0000 mg | ORAL_TABLET | Freq: Every day | ORAL | 1 refills | Status: DC

## 2017-06-13 MED ORDER — AMLODIPINE BESYLATE 5 MG PO TABS: 5.0000 mg | ORAL_TABLET | Freq: Every day | ORAL | 1 refills | Status: DC

## 2017-06-13 MED ORDER — GI COCKTAIL ~~LOC~~
30.0000 mL | Freq: Once | ORAL | Status: AC
  Administered 2017-06-13: 30 mL via ORAL
  Filled 2017-06-13: qty 30

## 2017-06-13 MED ORDER — POTASSIUM CHLORIDE CRYS ER 20 MEQ PO TBCR
60.0000 meq | EXTENDED_RELEASE_TABLET | Freq: Once | ORAL | Status: AC
  Administered 2017-06-13: 60 meq via ORAL
  Filled 2017-06-13: qty 3

## 2017-06-13 MED ORDER — AMLODIPINE BESYLATE 5 MG PO TABS
5.0000 mg | ORAL_TABLET | Freq: Once | ORAL | Status: AC
  Administered 2017-06-13: 5 mg via ORAL
  Filled 2017-06-13: qty 1

## 2017-06-13 MED ORDER — HYDROCHLOROTHIAZIDE 12.5 MG PO CAPS
25.0000 mg | ORAL_CAPSULE | Freq: Once | ORAL | Status: AC
  Administered 2017-06-13: 25 mg via ORAL
  Filled 2017-06-13: qty 2

## 2017-06-13 MED ORDER — ONDANSETRON HCL 4 MG/2ML IJ SOLN
4.0000 mg | Freq: Once | INTRAMUSCULAR | Status: AC
  Administered 2017-06-13: 4 mg via INTRAVENOUS
  Filled 2017-06-13: qty 2

## 2017-06-13 NOTE — ED Provider Notes (Signed)
[facility] DEPT Provider Note   CSN: [id number] Arrival date & time: 06/13/17  1129     History   Chief Complaint Chief Complaint  Patient presents with  . Abdominal Pain  . Hematuria    HPI Seth Mcdaniel is a 51 y.o. male with a PMHx of HTN, HepC, and "stomach ulcers", who presents to the ED with complaints of one month of gradually worsening epigastric/LUQ abdominal pain. He has not seen anybody for this issue, does not currently have a PCP, nor does he receive any care for his reported hep C for his hypertension. He describes the pain as 10/10 constant sharp and 9 epigastric/LUQ pain that radiates into the left flank area, worse with eating specifically spicy foods, and unrelieved with Pepto-Bismol and Percocet today. He states that previously Pepto was working however it has stopped helping him. He reports nausea and 4 episodes of nonbloody nonbilious emesis today. He states that one week ago he had 2 days of hematuria however that has stopped. No known history of nephrolithiasis. He admits to being a cigarette smoker. He also admits that he takes NSAIDs frequently. Lastly he admits that he has not been compliant with any blood pressure medications in over one year. He was previously on HCTZ 25 mg.  He denies fevers, chills, CP, SOB, cough, LE swelling, diarrhea/constipation, obstipation, melena, hematochezia, hematemesis, ongoing hematuria, dysuria, testicular pain/swelling, penile discharge, myalgias, arthralgias, HA, vision changes, lightheadedness, numbness, tingling, focal weakness, or any other complaints at this time. Denies recent travel, sick contacts, suspicious food intake, EtOH use, or prior abd surgeries.    The history is provided by the patient and medical records. No language interpreter was used.  Abdominal Pain   This is a new problem. The current episode started more than 1 week ago. The problem occurs constantly. The problem has been gradually worsening.  The pain is associated with eating. The pain is located in the epigastric region and LUQ. The quality of the pain is sharp (gnawing). The pain is at a severity of 10/10. The pain is moderate. Associated symptoms include nausea, vomiting and hematuria (last week, since resolved). Pertinent negatives include fever, diarrhea, flatus, hematochezia, melena, constipation, dysuria, headaches, arthralgias and myalgias. The symptoms are aggravated by eating. Nothing relieves the symptoms. His past medical history is significant for PUD.  Hematuria  Associated symptoms include abdominal pain. Pertinent negatives include no chest pain, no headaches and no shortness of breath.    Past Medical History:  Diagnosis Date  . Hypertension     Patient Active Problem List   Diagnosis Date Noted  . Dental caries extending into pulp 12/02/2013  . Tooth infection 12/02/2013  . Hypertensive urgency 12/02/2013  . 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 No pertinent surgical history.     Home Medications    Prior to Admission medications   Medication Sig Start Date End Date Taking? Authorizing Provider  acetaminophen (TYLENOL) 500 MG tablet Take 1,500 mg by mouth every 6 (six) hours as needed for mild pain.   Yes [provider]  bismuth subsalicylate (PEPTO BISMOL) 262 MG/15ML suspension Take 30 mLs by mouth every 6 (six) hours as needed for diarrhea or loose stools (stomach pain).   Yes [provider]  ibuprofen (ADVIL,MOTRIN) 200 MG tablet Take 600 mg by mouth every 6 (six) hours as needed for mild pain.   Yes [provider]  hydrochlorothiazide (HYDRODIURIL) 25 MG tablet Take 1 tablet (25 mg total) by mouth daily. Patient  not taking: Reported on 06/13/2017 07/12/15   Harvel Quale, MD    Family History No family history on file.  Social History Social History  Substance Use Topics  . Smoking status: Current Every Day Smoker    Packs/day: 1.00    Types: Cigarettes    . Smokeless tobacco: Former Systems developer  . Alcohol use No     Allergies   Penicillins   Review of Systems Review of Systems  Constitutional: Negative for chills and fever.  Eyes: Negative for visual disturbance.  Respiratory: Negative for cough and shortness of breath.   Cardiovascular: Negative for chest pain and leg swelling.  Gastrointestinal: Positive for abdominal pain, nausea and vomiting. Negative for blood in stool, constipation, diarrhea, flatus, hematochezia and melena.  Genitourinary: Positive for hematuria (last week, since resolved). Negative for discharge, dysuria, scrotal swelling and testicular pain.  Musculoskeletal: Negative for arthralgias and myalgias.  Skin: Negative for color change.  Allergic/Immunologic: Negative for immunocompromised state.  Neurological: Negative for weakness, light-headedness, numbness and headaches.  Psychiatric/Behavioral: Negative for confusion.   All other systems reviewed and are negative for acute change except as noted in the HPI.    Physical Exam Updated Vital Signs BP (!) 268/135 (BP Location: Right Arm)   Pulse 64   Temp 97.9 F (36.6 C) (Oral)   Resp 18   Ht 6\' 2"  (1.88 m)   Wt 97.5 kg (215 lb)   SpO2 99%   BMI 27.60 kg/m   Physical Exam  Constitutional: He is oriented to person, place, and time. He appears well-developed and well-nourished.  Non-toxic appearance. No distress.  Afebrile, nontoxic, NAD, BP 260s/130s slightly more elevated than prior visit in 2016  HENT:  Head: Normocephalic and atraumatic.  Mouth/Throat: Oropharynx is clear and moist and mucous membranes are normal.  Eyes: Conjunctivae and EOM are normal. Right eye exhibits no discharge. Left eye exhibits no discharge.  Neck: Normal range of motion. Neck supple.  Cardiovascular: Normal rate, regular rhythm, normal heart sounds and intact distal pulses.  Exam reveals no gallop and no friction rub.   No murmur heard. Pulmonary/Chest: Effort normal. No  respiratory distress. He has no decreased breath sounds. He has no wheezes. He has rhonchi in the right lower field. He has no rales.  Faint rhonchi in RLL which clears with cough, no rales/wheezing, no hypoxia or increased WOB, speaking in full sentences, SpO2 100% on RA   Abdominal: Soft. Normal appearance and bowel sounds are normal. He exhibits no distension. There is tenderness in the epigastric area and left upper quadrant. There is no rigidity, no rebound, no guarding, no CVA tenderness, no tenderness at McBurney's point and negative Murphy's sign.  Soft, nondistended, +BS throughout, with mild LUQ/epigastric TTP, no r/g/r, neg murphy's, neg mcburney's, no CVA TTP   Musculoskeletal: Normal range of motion.  MAE x4 Strength and sensation grossly intact in all extremities Distal pulses intact No pedal edema  Neurological: He is alert and oriented to person, place, and time. He has normal strength. No sensory deficit.  Skin: Skin is warm, dry and intact. No rash noted.  Psychiatric: He has a normal mood and affect.  Nursing note and vitals reviewed.    ED Treatments / Results  Labs (all labs ordered are listed, but only abnormal results are displayed) Labs Reviewed  URINALYSIS, ROUTINE W REFLEX MICROSCOPIC - Abnormal; Notable for the following:       Result Value   Color, Urine COLORLESS (*)    Glucose,  UA 50 (*)    Hgb urine dipstick SMALL (*)    Protein, ur 100 (*)    All other components within normal limits  COMPREHENSIVE METABOLIC PANEL - Abnormal; Notable for the following:    Potassium 3.1 (*)    Chloride 97 (*)    Glucose, Bld 118 (*)    Total Protein 9.0 (*)    ALT 15 (*)    All other components within normal limits  CBC - Abnormal; Notable for the following:    WBC 10.8 (*)    All other components within normal limits  TROPONIN I - Abnormal; Notable for the following:    Troponin I 0.04 (*)    All other components within normal limits  TROPONIN I - Abnormal;  Notable for the following:    Troponin I 0.04 (*)    All other components within normal limits  LIPASE, BLOOD  I-STAT TROPONIN, ED    EKG  EKG Interpretation  Date/Time:  Wednesday June 13 2017 14:47:22 EDT Ventricular Rate:  58 PR Interval:    QRS Duration: 100 QT Interval:  457 QTC Calculation: 449 R Axis:   22 Text Interpretation:  Sinus rhythm Probable left atrial enlargement LVH with secondary repolarization abnormality Anterior Q waves, possibly due to LVH Confirmed by Virgel Manifold [phone]) on 06/13/2017 3:42:59 PM       Radiology Dg Chest 2 View  Result Date: 06/13/2017 CLINICAL DATA:  Rhonchi. EXAM: CHEST  2 VIEW COMPARISON:  None. FINDINGS: The heart size and mediastinal contours are within normal limits. Both lungs are clear. No pneumothorax or pleural effusion is noted. The visualized skeletal structures are unremarkable. IMPRESSION: No active cardiopulmonary disease. Electronically Signed   By: Marijo Conception, M.D.   On: 06/13/2017 15:19   Ct Renal Stone Study  Result Date: 06/13/2017 CLINICAL DATA:  Generalized abdominal pain for 1 month. EXAM: CT ABDOMEN AND PELVIS WITHOUT CONTRAST TECHNIQUE: Multidetector CT imaging of the abdomen and pelvis was performed following the standard protocol without IV contrast. COMPARISON:  None. FINDINGS: Lower chest: No acute abnormality. Hepatobiliary: No focal liver abnormality is seen. No gallstones, gallbladder wall thickening, or biliary dilatation. Pancreas: Unremarkable. No pancreatic ductal dilatation or surrounding inflammatory changes. Spleen: Normal in size without focal abnormality. Adrenals/Urinary Tract: Adrenal glands appear normal. Simple left renal cyst is noted. Probable small nonobstructive left renal calculus is noted. No hydronephrosis or renal obstruction is noted. Urinary bladder is unremarkable. Stomach/Bowel: Stomach is within normal limits. Appendix appears normal. No evidence of bowel wall thickening,  distention, or inflammatory changes. Vascular/Lymphatic: Aortic atherosclerosis. No enlarged abdominal or pelvic lymph nodes. Reproductive: Prostate is unremarkable. Other: No abdominal wall hernia or abnormality. No abdominopelvic ascites. Musculoskeletal: No acute or significant osseous findings. IMPRESSION: Small nonobstructive left renal calculus. No hydronephrosis or renal obstruction is noted. Aortic atherosclerosis. Electronically Signed   By: Marijo Conception, M.D.   On: 06/13/2017 15:15    Procedures Procedures (including critical care time)  Medications Ordered in ED Medications  cloNIDine (CATAPRES) tablet 0.1 mg (0.1 mg Oral Given 06/13/17 1435)  hydrochlorothiazide (MICROZIDE) capsule 25 mg (25 mg Oral Given 06/13/17 1435)  famotidine (PEPCID) IVPB 20 mg premix (0 mg Intravenous Stopped 06/13/17 1505)  gi cocktail (Maalox,Lidocaine,Donnatal) (30 mLs Oral Given 06/13/17 1435)  ondansetron (ZOFRAN) injection 4 mg (4 mg Intravenous Given 06/13/17 1435)  morphine 4 MG/ML injection 4 mg (4 mg Intravenous Given 06/13/17 1435)  sodium chloride 0.9 % bolus 500 mL (0 mLs Intravenous Stopped  06/13/17 1658)  potassium chloride SA (K-DUR,KLOR-CON) CR tablet 60 mEq (60 mEq Oral Given 06/13/17 1657)  morphine 4 MG/ML injection 4 mg (4 mg Intravenous Given 06/13/17 1652)  amLODipine (NORVASC) tablet 5 mg (5 mg Oral Given 06/13/17 1724)     Initial Impression / Assessment and Plan / ED Course  I have reviewed the triage vital signs and the nursing notes.  Pertinent labs & imaging results that were available during my care of the patient were reviewed by me and considered in my medical decision making (see chart for details).     51 y.o. male here with 1 month of gradually worsening epigastric/LUQ/L flank pain, n/v today, hematuria a few days ago which resolved. Noted to have HTN 260s/130s, noncompliant with HCTZ x1 yr, no PCP care. Hx of hepC per pt, but not under any care for this. On exam, mild LUQ/epigastric  TTP, no flank tenderness, nonperitoneal; faint rhonchi in RLL which clears with cough. Work up thus far reveals: U/A with small Hgb, 0-5 RBCs and WBCs, no bacteria or squamous cells seen, nitrite and leuk neg. CBC with marginally elevated WBC 10.8 but otherwise unremarkable. Awaiting CMP and lipase. Will add-on troponin, EKG, CXR, CT renal study, and give fluids, morphine, zofran, GI cocktail, pepcid, and work on getting BP under control with clonidine and HCTZ. Will reassess shortly  4:35 PM CMP with mildly low K 3.1, will replete. Lipase WNL. Trop 0.04. EKG with LVH which is more pronounced compared to prior EKG, and TWI in V4-5 which is new compared to prior EKG from 2016. CXR neg. CT renal study showing small nonobstructing L renal stone but otherwise negative. BP after oral meds still 249/136. Discussed with Dr. Wilson Singer regarding pt's persistent HTN and elevated troponin; he will see pt prior to ordering further medications at this time. Pt feeling somewhat better, pain still ongoing; will give a little bit more pain medication. Will reassess shortly.   4:53 PM Dr. Wilson Singer saw pt, feels as though this is unlikely to be cardiac in nature; wants repeat troponin, if not significantly elevated compared to last one, then could likely d/c home with norvasc and HCTZ to gradually bring his BP down, since he likely has been elevated to this degree for quite some time. Does not feel we need to give another antiHTN med here, but will give norvasc here to ensure no adverse reaction since we plan on sending him home with this. Will monitor and reassess after pain med dose given and repeat troponin results. Of note, pt very adamant that he does not want to stay in the hospital "for his blood pressure" and that he's "fine because it's always high". Will reassess shortly.   6:44 PM Repeat trop again 0.04, and actually I-stat troponin WNL at 0.01; doubt this finding requires further emergent work up or care. Repeat BP  230/127, which continues to trend down since arrival. Pt feeling well, tolerating PO well, and wanting to go home. Overall, symptoms likely gastritis related, could be contributed by kidney stone however less likely given that the stone is not mobile and is in the kidney itself. Discussed diet/lifestyle modifications for symptoms, will start on zantac/zofran, advised tylenol and avoidance/sparing use of NSAIDs, discussed other OTC remedies for symptomatic relief. For BP, advised DASH diet, will start on HCTZ 25mg  and Norvasc 5mg , Tobacco cessation advised. F/up with [facility] in 1wk for recheck of symptoms and for ongoing medical care and management of BP. I explained the diagnosis and have  given explicit precautions to return to the ER including for any other new or worsening symptoms. The patient understands and accepts the medical plan as it's been dictated and I have answered their questions. Discharge instructions concerning home care and prescriptions have been given. The patient is STABLE and is discharged to home in good condition.    Final Clinical Impressions(s) / ED Diagnoses   Final diagnoses:  Colicky LUQ abdominal pain  Nausea and vomiting in adult patient  Gastritis, presence of bleeding unspecified, unspecified chronicity, unspecified gastritis type  Hypertensive urgency  Tobacco user  Other microscopic hematuria  Nephrolithiasis  Hypokalemia  Elevated troponin I level  Abnormal EKG    New Prescriptions New Prescriptions   AMLODIPINE (NORVASC) 5 MG TABLET    Take 1 tablet (5 mg total) by mouth daily.   HYDROCHLOROTHIAZIDE (HYDRODIURIL) 25 MG TABLET    Take 1 tablet (25 mg total) by mouth daily.   ONDANSETRON (ZOFRAN) 8 MG TABLET    Take 1 tablet (8 mg total) by mouth every 8 (eight) hours as needed for nausea or vomiting.   RANITIDINE (ZANTAC) 150 MG TABLET    Take 1 tablet (150 mg total) by mouth 2 (two) times daily.     [address] [place], [place] 06/13/17 1854    Virgel Manifold, MD 06/14/17 [phone]

## 2017-06-13 NOTE — ED Notes (Signed)
Pt had drawn for labs:  Gold Blue Lavender Pink Lt green  Dark green

## 2017-06-13 NOTE — ED Triage Notes (Addendum)
Pt complains of abdominal pain for the past month. Pt has nausea and vomiting today. Pt denies diarrhea. Pt states has hx of hepatitis c and ulcers. Pt states he tried pepto bismal, which helped at first but no longer helps. Pt states he noticed blood in his urine 2 days ago.

## 2017-06-13 NOTE — Discharge Instructions (Signed)
Your abdominal pain is likely from gastritis or an ulcer, however it could potentially be from a small kidney stone that you have in your left kidney. You will need to take zantac as directed, and avoid spicy/fatty/acidic foods, avoid soda/coffee/tea/alcohol. Avoid laying down flat within 30 minutes of eating. Avoid NSAIDs like ibuprofen/aleve/motrin/etc on an empty stomach. May consider using over the counter tums/maalox as needed for additional relief. Use zofran as directed as needed for nausea. Use tylenol as needed for pain.   Your blood pressure was very elevated today, it's very important that you take your blood pressure medications as directed, and stop smoking. Follow a low-salt diet outlined below to help with your blood pressure control. You must follow up with a regular doctor to have ongoing care and management of your blood pressure.  [facility] up with [facility] in 5-7 days for recheck of symptoms, to establish medical care, and for ongoing management of your blood pressure. Return to the ER for changes or worsening symptoms.

## 2018-10-17 ENCOUNTER — Encounter (HOSPITAL_COMMUNITY): Payer: Self-pay | Admitting: Emergency Medicine

## 2018-10-17 ENCOUNTER — Emergency Department (HOSPITAL_COMMUNITY)

## 2018-10-17 ENCOUNTER — Inpatient Hospital Stay (HOSPITAL_COMMUNITY)
Admission: EM | Admit: 2018-10-17 | Discharge: 2018-10-21 | DRG: 310 | Attending: Internal Medicine | Admitting: Internal Medicine

## 2018-10-17 DIAGNOSIS — I4891 Unspecified atrial fibrillation: Principal | ICD-10-CM

## 2018-10-17 DIAGNOSIS — R079 Chest pain, unspecified: Secondary | ICD-10-CM | POA: Diagnosis present

## 2018-10-17 DIAGNOSIS — Z79899 Other long term (current) drug therapy: Secondary | ICD-10-CM

## 2018-10-17 DIAGNOSIS — R600 Localized edema: Secondary | ICD-10-CM | POA: Diagnosis present

## 2018-10-17 DIAGNOSIS — I10 Essential (primary) hypertension: Secondary | ICD-10-CM | POA: Diagnosis present

## 2018-10-17 DIAGNOSIS — Z87891 Personal history of nicotine dependence: Secondary | ICD-10-CM

## 2018-10-17 DIAGNOSIS — F419 Anxiety disorder, unspecified: Secondary | ICD-10-CM | POA: Diagnosis present

## 2018-10-17 LAB — BASIC METABOLIC PANEL WITH GFR
Anion gap: 11 (ref 5–15)
BUN: 7 mg/dL (ref 6–20)
CO2: 24 mmol/L (ref 22–32)
Calcium: 9.9 mg/dL (ref 8.9–10.3)
Chloride: 100 mmol/L (ref 98–111)
Creatinine, Ser: 0.92 mg/dL (ref 0.61–1.24)
GFR calc Af Amer: >60 mL/min (ref >60)
GFR calc non Af Amer: >60 mL/min (ref >60)
Glucose, Bld: 102 mg/dL — ABNORMAL HIGH (ref 70–99)
Potassium: 3.7 mmol/L (ref 3.5–5.1)
Sodium: 135 mmol/L (ref 135–145)

## 2018-10-17 LAB — CBC
HEMATOCRIT: 49.9 % (ref 39.0–52.0)
Hemoglobin: 17 g/dL (ref 13.0–17.0)
MCH: 30.5 pg (ref 26.0–34.0)
MCHC: 34.1 g/dL (ref 30.0–36.0)
MCV: 89.6 fL (ref 80.0–100.0)
NRBC: 0 % (ref 0.0–0.2)
PLATELETS: 331 10*3/uL (ref 150–400)
RBC: 5.57 MIL/uL (ref 4.22–5.81)
RDW: 13.5 % (ref 11.5–15.5)
WBC: 10.1 10*3/uL (ref 4.0–10.5)

## 2018-10-17 LAB — I-STAT TROPONIN, ED
Troponin i, poc: 0 ng/mL (ref 0.00–0.08)
Troponin i, poc: 0.01 ng/mL (ref 0.00–0.08)

## 2018-10-17 LAB — MAGNESIUM: Magnesium: 2.3 mg/dL (ref 1.7–2.4)

## 2018-10-17 LAB — PROTIME-INR
INR: 1.06
Prothrombin Time: 13.7 s (ref 11.4–15.2)

## 2018-10-17 MED ORDER — APIXABAN 5 MG PO TABS
5.0000 mg | ORAL_TABLET | Freq: Two times a day (BID) | ORAL | Status: DC
  Administered 2018-10-18 – 2018-10-21 (×7): 5 mg via ORAL
  Filled 2018-10-17 (×8): qty 1

## 2018-10-17 MED ORDER — IOPAMIDOL (ISOVUE-370) INJECTION 76%
INTRAVENOUS | Status: AC
  Administered 2018-10-17: 100 mL
  Filled 2018-10-17: qty 100

## 2018-10-17 NOTE — ED Provider Notes (Signed)
[facility] EMERGENCY DEPARTMENT Provider Note   CSN: [id number] Arrival date & time: 10/17/18  1735     History   Chief Complaint Chief Complaint  Patient presents with  . Chest Pain    HPI Seth Mcdaniel is a 53 y.o. male.  The history is provided by the patient.     Patient is a 53 year old male with a past medical history of hypertension and tobacco abuse who presents for evaluation of intermittent episodes of chest pain and A. fib which was seen on EKG done on a routine basis at present clinic.  Patient states he has had intermittent chest pressure that is located in the substernal area over the last 2 to 3 weeks.  He states the last episode occurred last night.  He states still episodes last between 30 and 45 minutes.  He states he feels that they radiate to his left arm but not his jaw or his back.  He states that he come on gradually.  He states he feels better when he lies flat but denies any other positional exertional factors.  Denies any other alleviating or aggravating factors.  Denies prior similar episodes.  Denies any recent headache, earache, sore throat, vomiting, diarrhea, dysuria, blood in stool, blood in his urine, rash, extremity pain numbness or tingling, or other acute complaints.  Denies illicit drug use.  Endorses drinking 3 cups of coffee per day.  Denies sick contacts or recent travel out of [place].  Denies any history of malignancy, CVA, or PE DVT.  Past Medical History:  Diagnosis Date  . Hypertension     Patient Active Problem List   Diagnosis Date Noted  . HTN (hypertension) 10/18/2018  . New onset a-fib ([place]) 10/18/2018  . Chest pain in adult 10/18/2018  . Chest pain 10/18/2018  . Dental caries extending into pulp 12/02/2013  . Tooth infection 12/02/2013  . Hypertensive urgency 12/02/2013  . 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 No pertinent surgical history.      Home Medications    Prior to  Admission medications   Medication Sig Start Date End Date Taking? Authorizing Provider  acetaminophen (TYLENOL) 500 MG tablet Take 1,500 mg by mouth every 6 (six) hours as needed for mild pain.    [provider]  amLODipine (NORVASC) 5 MG tablet Take 1 tablet (5 mg total) by mouth daily. 06/13/17   Street, [place], PA-C  bismuth subsalicylate (PEPTO BISMOL) 262 MG/15ML suspension Take 30 mLs by mouth every 6 (six) hours as needed for diarrhea or loose stools (stomach pain).    [provider]  hydrochlorothiazide (HYDRODIURIL) 25 MG tablet Take 1 tablet (25 mg total) by mouth daily. Patient not taking: Reported on 06/13/2017 07/12/15   Harvel Quale, MD  hydrochlorothiazide (HYDRODIURIL) 25 MG tablet Take 1 tablet (25 mg total) by mouth daily. 06/13/17   Street, [place], PA-C  ibuprofen (ADVIL,MOTRIN) 200 MG tablet Take 600 mg by mouth every 6 (six) hours as needed for mild pain.    [provider]  ondansetron (ZOFRAN) 8 MG tablet Take 1 tablet (8 mg total) by mouth every 8 (eight) hours as needed for nausea or vomiting. 06/13/17   Street, [place], PA-C  ranitidine (ZANTAC) 150 MG tablet Take 1 tablet (150 mg total) by mouth 2 (two) times daily. 06/13/17   Street, , PA-C    Family History History reviewed. No pertinent family history.  Social History Social History   Tobacco Use  .  Smoking status: Current Every Day Smoker    Packs/day: 1.00    Types: Cigarettes  . Smokeless tobacco: Former Network engineer Use Topics  . Alcohol use: No  . Drug use: No     Allergies   Penicillins   Review of Systems Review of Systems  Constitutional: Negative for chills and fever.  HENT: Negative for ear pain and sore throat.   Eyes: Negative for pain and visual disturbance.  Respiratory: Negative for cough and shortness of breath.   Cardiovascular: Positive for chest pain. Negative for palpitations.  Gastrointestinal: Negative for abdominal pain and vomiting.    Genitourinary: Negative for dysuria and hematuria.  Musculoskeletal: Negative for arthralgias and back pain.  Skin: Negative for color change and rash.  Neurological: Negative for seizures and syncope.  All other systems reviewed and are negative.   Physical Exam Updated Vital Signs BP (!) 138/110   Pulse 77   Temp (!) 97.5 F (36.4 C) (Oral)   Resp 15   Ht 6\' 3"  (1.905 m)   Wt 123.8 kg   SpO2 95%   BMI 34.12 kg/m   Physical Exam Vitals signs and nursing note reviewed.  Constitutional:      Appearance: He is well-developed.  HENT:     Head: Normocephalic and atraumatic.  Eyes:     Extraocular Movements: Extraocular movements intact.     Conjunctiva/sclera: Conjunctivae normal.     Pupils: Pupils are equal, round, and reactive to light.  Neck:     Musculoskeletal: Normal range of motion and neck supple.  Cardiovascular:     Rate and Rhythm: Normal rate and regular rhythm.     Heart sounds: No murmur.  Pulmonary:     Effort: Pulmonary effort is normal. No respiratory distress.     Breath sounds: Normal breath sounds. No decreased breath sounds.  Abdominal:     Palpations: Abdomen is soft.     Tenderness: There is no abdominal tenderness.  Musculoskeletal:     Right lower leg: No edema.  Skin:    General: Skin is warm and dry.     Capillary Refill: Capillary refill takes less than 2 seconds.  Neurological:     General: No focal deficit present.     Mental Status: He is alert.     Cranial Nerves: No cranial nerve deficit.      ED Treatments / Results  Labs (all labs ordered are listed, but only abnormal results are displayed) Labs Reviewed  BASIC METABOLIC PANEL - Abnormal; Notable for the following components:      Result Value   Glucose, Bld 102 (*)    All other components within normal limits  CBC  PROTIME-INR  MAGNESIUM  TSH  HIV ANTIBODY (ROUTINE TESTING W REFLEX)  TROPONIN I  TROPONIN I  TROPONIN I  COMPREHENSIVE METABOLIC PANEL  CBC  BRAIN  NATRIURETIC PEPTIDE  I-STAT TROPONIN, ED  I-STAT TROPONIN, ED    EKG EKG Interpretation  Date/Time:  Thursday October 17 2018 17:42:01 EST Ventricular Rate:  84 PR Interval:    QRS Duration: 84 QT Interval:  390 QTC Calculation: 460 R Axis:   -47 Text Interpretation:  Atrial fibrillation Left axis deviation Abnormal ECG When compared to prior ECG, new Aflutter.  No STEMI Confirmed by Antony Blackbird [phone]) on 10/17/2018 9:16:52 PM   Radiology Dg Chest 2 View  Result Date: 10/17/2018 CLINICAL DATA:  Chest pain EXAM: CHEST - 2 VIEW COMPARISON:  None. FINDINGS: The lungs are clear without  focal pneumonia, edema, pneumothorax or pleural effusion. Nodular density identified in the right infrahilar region on the frontal projection. The cardiopericardial silhouette is within normal limits for size. The visualized bony structures of the thorax are intact. IMPRESSION: Right infrahilar lung nodule, potentially related to vasculature. CT chest without contrast recommended to further evaluate. Electronically Signed   By: Misty Stanley M.D.   On: 10/17/2018 18:34   Ct Angio Chest Pe W And/or Wo Contrast  Result Date: 10/17/2018 CLINICAL DATA:  Chest pain with intermittent dyspnea x4 days. Infrahilar nodular density seen on same day chest radiograph. EXAM: CT ANGIOGRAPHY CHEST WITH CONTRAST TECHNIQUE: Multidetector CT imaging of the chest was performed using the standard protocol during bolus administration of intravenous contrast. Multiplanar CT image reconstructions and MIPs were obtained to evaluate the vascular anatomy. CONTRAST:  166mL ISOVUE-370 IOPAMIDOL (ISOVUE-370) INJECTION 76% COMPARISON:  Same day CXR FINDINGS: Cardiovascular: Satisfactory opacification of the pulmonary arteries to the segmental level. No evidence of pulmonary embolism. Normal heart size. No pericardial effusion. Nonaneurysmal minimally atherosclerotic thoracic aorta. Mediastinum/Nodes: Borderline enlarged hilar lymph nodes. No  mediastinal adenopathy. No enlargement of the axillary lymph nodes. Thyroid gland, trachea, and esophagus demonstrate no significant findings. Lungs/Pleura: Tiny perifissural lymph nodes are suggested bilaterally along the major fissures accounting for subtle nodular densities, the largest on the right measuring 4 mm and on left 2 mm. A 4.5 mm noncalcified right middle lobe nodule, series 7/60 is identified with a 4 mm lingular nodule, series 7/72. Bibasilar dependent atelectasis is seen. There is no pneumothorax or effusion. Upper Abdomen: No acute abnormality. A partially included left interpolar renal cyst measuring up to 2.7 cm is identified. No nephrolithiasis nor obstructive uropathy. Musculoskeletal: No acute nor suspicious osseous abnormality. Subcutaneous nodule is noted along the dorsum of the thorax measuring up to 2.3 cm, nonspecific but possibly large sebaceous cyst. Review of the MIP images confirms the above findings. IMPRESSION: 1. No acute pulmonary embolus, aortic aneurysm or dissection. 2. No pulmonary nodule in the infrahilar portion of the chest to account for the nodular density seen on the chest radiograph. Findings most likely represented a pulmonary vessel seen on end. 3. 4.5 mm noncalcified right middle lobe nodule and 4 mm lingular nodule. Probable tiny perifissural lymph nodes are noted bilaterally along the major fissures as well. No follow-up needed if patient is low-risk (and has no known or suspected primary neoplasm). Non-contrast chest CT can be considered in 12 months if patient is high-risk. This recommendation follows the consensus statement: Guidelines for Management of Incidental Pulmonary Nodules Detected on CT Images: From the Fleischner Society 2017; Radiology 2017; 284:228-243. 4. Subcutaneous nodule measuring up 2.3 cm along the posterior aspect of the thorax. Findings might represent a sebaceous cyst but is nonspecific on CT. Aortic Atherosclerosis (ICD10-I70.0).  Electronically Signed   By: Ashley Royalty M.D.   On: 10/17/2018 23:14    Procedures Procedures (including critical care time)  Medications Ordered in ED Medications  apixaban (ELIQUIS) tablet 5 mg (has no administration in time range)  acetaminophen (TYLENOL) tablet 650 mg (has no administration in time range)    Or  acetaminophen (TYLENOL) suppository 650 mg (has no administration in time range)  metoprolol tartrate (LOPRESSOR) tablet 25 mg (25 mg Oral Given 10/18/18 0058)  amLODipine (NORVASC) tablet 5 mg (has no administration in time range)  furosemide (LASIX) injection 40 mg (has no administration in time range)  iopamidol (ISOVUE-370) 76 % injection (100 mLs  Contrast Given 10/17/18 2234)  apixaban (  ELIQUIS) tablet 5 mg (5 mg Oral Given 10/18/18 0058)     Initial Impression / Assessment and Plan / ED Course  I have reviewed the triage vital signs and the nursing notes.  Pertinent labs & imaging results that were available during my care of the patient were reviewed by me and considered in my medical decision making (see chart for details).     Patient is a 53 year old male who presents with above-stated history exam.  On presentation patient is afebrile stable vital signs.  ECG shows A. fib with a ventricular rate of 84, left axis deviation, and no other signs of acute ischemic change.  There is normal axis and normal intervals otherwise.  Troponin 0 0.00.  In addition repeat 3-hour troponin is 0.01.  Given absence of chest pain on presentation, nonischemic EKG, and above troponin have a low suspicion for ACS.  Given patient has known history of A. fib and cannot give a time of onset this appears to be new A. fib for the patient.  BMP shows no significant electrolyte or metabolic abnormalities.  CBC shows a WBC count of 10.1, hemoglobin 17, platelets 331.  I would low suspicion for PE, pneumothorax, or pneumonia given CTA chest remarkable for "No acute pulmonary embolus, aortic aneurysm or  dissection. No pulmonary nodule in the infrahilar portion of the chest to account for the nodular density seen on the chest radiograph. Findings most likely represented a pulmonary vessel seen on end. 4.5 mm noncalcified right middle lobe nodule and 4 mm lingular nodule. Probable tiny perifissural lymph nodes are noted bilaterally along the major fissures as well. No follow-up needed if patient is low-risk (and has no known or suspected primary neoplasm). Non-contrast chest CT can be considered in 12 months if patient is high-risk. This recommendation follows the consensus statement: Guidelines for Management of Incidental Pulmonary Nodules Detected on CT Images: From the Fleischner Society 2017; Radiology 2017; 284:228-243. Subcutaneous nodule measuring up 2.3 cm along the posterior aspect of the thorax. Findings might represent a sebaceous cyst but is nonspecific on CT."  Cardiology service consulted.  In brief the recommended admission for observation, TTE, and initiation of Xarelto.  TTE ordered and Xarelto ordered.  Patient admitted to hospital service in stable condition for further evaluation management.       Final Clinical Impressions(s) / ED Diagnoses   Final diagnoses:  Atrial fibrillation, unspecified type [facility])  Chest pain, unspecified type    ED Discharge Orders    None       Hulan Saas, MD 10/18/18 0106    Tegeler, Gwenyth Allegra, MD 10/18/18 1159

## 2018-10-17 NOTE — ED Triage Notes (Addendum)
Pt started having CP a few days ago. Denies N/V/SOB. Pain is nonradiating. Pt also complains of bilateral lower leg swelling that has been going on for a month. [facility] EKG showing AFib today. This is new onset.

## 2018-10-18 ENCOUNTER — Observation Stay (HOSPITAL_COMMUNITY)

## 2018-10-18 ENCOUNTER — Other Ambulatory Visit: Payer: Self-pay

## 2018-10-18 DIAGNOSIS — Z79899 Other long term (current) drug therapy: Secondary | ICD-10-CM | POA: Diagnosis not present

## 2018-10-18 DIAGNOSIS — R600 Localized edema: Secondary | ICD-10-CM | POA: Diagnosis not present

## 2018-10-18 DIAGNOSIS — I10 Essential (primary) hypertension: Secondary | ICD-10-CM

## 2018-10-18 DIAGNOSIS — R079 Chest pain, unspecified: Secondary | ICD-10-CM | POA: Diagnosis not present

## 2018-10-18 DIAGNOSIS — Z87891 Personal history of nicotine dependence: Secondary | ICD-10-CM | POA: Diagnosis not present

## 2018-10-18 DIAGNOSIS — I4891 Unspecified atrial fibrillation: Secondary | ICD-10-CM | POA: Diagnosis present

## 2018-10-18 DIAGNOSIS — I370 Nonrheumatic pulmonary valve stenosis: Secondary | ICD-10-CM | POA: Diagnosis not present

## 2018-10-18 DIAGNOSIS — F419 Anxiety disorder, unspecified: Secondary | ICD-10-CM | POA: Diagnosis present

## 2018-10-18 LAB — CBC
HCT: 48.9 % (ref 39.0–52.0)
Hemoglobin: 16.8 g/dL (ref 13.0–17.0)
MCH: 30.7 pg (ref 26.0–34.0)
MCHC: 34.4 g/dL (ref 30.0–36.0)
MCV: 89.2 fL (ref 80.0–100.0)
Platelets: 339 10*3/uL (ref 150–400)
RBC: 5.48 MIL/uL (ref 4.22–5.81)
RDW: 13.7 % (ref 11.5–15.5)
WBC: 8.3 10*3/uL (ref 4.0–10.5)
nRBC: 0 % (ref 0.0–0.2)

## 2018-10-18 LAB — COMPREHENSIVE METABOLIC PANEL
ALT: 82 U/L — ABNORMAL HIGH (ref 0–44)
ANION GAP: 9 (ref 5–15)
AST: 65 U/L — ABNORMAL HIGH (ref 15–41)
Albumin: 3.6 g/dL (ref 3.5–5.0)
Alkaline Phosphatase: 58 U/L (ref 38–126)
BILIRUBIN TOTAL: 0.8 mg/dL (ref 0.3–1.2)
BUN: 11 mg/dL (ref 6–20)
CO2: 23 mmol/L (ref 22–32)
Calcium: 9.4 mg/dL (ref 8.9–10.3)
Chloride: 103 mmol/L (ref 98–111)
Creatinine, Ser: 1.14 mg/dL (ref 0.61–1.24)
GFR calc Af Amer: >60 mL/min (ref >60)
GFR calc non Af Amer: >60 mL/min (ref >60)
Glucose, Bld: 82 mg/dL (ref 70–99)
Potassium: 3.9 mmol/L (ref 3.5–5.1)
Sodium: 135 mmol/L (ref 135–145)
TOTAL PROTEIN: 8.1 g/dL (ref 6.5–8.1)

## 2018-10-18 LAB — HIV ANTIBODY (ROUTINE TESTING W REFLEX): HIV Screen 4th Generation wRfx: NONREACTIVE

## 2018-10-18 LAB — BRAIN NATRIURETIC PEPTIDE: B Natriuretic Peptide: 62.1 pg/mL (ref 0.0–100.0)

## 2018-10-18 LAB — TROPONIN I
Troponin I: <0.03 ng/mL (ref <0.03)
Troponin I: <0.03 ng/mL (ref <0.03)

## 2018-10-18 LAB — TSH: TSH: 1.995 u[IU]/mL (ref 0.350–4.500)

## 2018-10-18 MED ORDER — SODIUM CHLORIDE 0.9% FLUSH: 3.0000 mL | INTRAVENOUS | Status: DC | PRN

## 2018-10-18 MED ORDER — FUROSEMIDE 10 MG/ML IJ SOLN
40.0000 mg | Freq: Every day | INTRAMUSCULAR | Status: DC
  Administered 2018-10-18 – 2018-10-19 (×2): 40 mg via INTRAVENOUS
  Filled 2018-10-18 (×2): qty 4

## 2018-10-18 MED ORDER — SODIUM CHLORIDE 0.9 % IV SOLN
250.0000 mL | INTRAVENOUS | Status: DC
  Administered 2018-10-21: 09:00:00 via INTRAVENOUS

## 2018-10-18 MED ORDER — ACETAMINOPHEN 650 MG RE SUPP: 650.0000 mg | Freq: Four times a day (QID) | RECTAL | Status: DC | PRN

## 2018-10-18 MED ORDER — SODIUM CHLORIDE 0.9% FLUSH
3.0000 mL | Freq: Two times a day (BID) | INTRAVENOUS | Status: DC
  Administered 2018-10-18 – 2018-10-21 (×4): 3 mL via INTRAVENOUS

## 2018-10-18 MED ORDER — AMLODIPINE BESYLATE 5 MG PO TABS
5.0000 mg | ORAL_TABLET | Freq: Every day | ORAL | Status: DC
  Administered 2018-10-18 – 2018-10-21 (×4): 5 mg via ORAL
  Filled 2018-10-18 (×4): qty 1

## 2018-10-18 MED ORDER — ACETAMINOPHEN 325 MG PO TABS
650.0000 mg | ORAL_TABLET | Freq: Four times a day (QID) | ORAL | Status: DC | PRN
  Administered 2018-10-18 – 2018-10-21 (×5): 650 mg via ORAL
  Filled 2018-10-18 (×5): qty 2

## 2018-10-18 MED ORDER — APIXABAN 5 MG PO TABS
5.0000 mg | ORAL_TABLET | Freq: Once | ORAL | Status: AC
  Administered 2018-10-18: 5 mg via ORAL
  Filled 2018-10-18: qty 1

## 2018-10-18 MED ORDER — METOPROLOL TARTRATE 25 MG PO TABS
25.0000 mg | ORAL_TABLET | Freq: Two times a day (BID) | ORAL | Status: DC
  Administered 2018-10-18 – 2018-10-21 (×8): 25 mg via ORAL
  Filled 2018-10-18 (×8): qty 1

## 2018-10-18 MED ORDER — ALPRAZOLAM 0.25 MG PO TABS
0.2500 mg | ORAL_TABLET | Freq: Once | ORAL | Status: AC
  Administered 2018-10-19: 0.25 mg via ORAL
  Filled 2018-10-18: qty 1

## 2018-10-18 NOTE — Consult Note (Addendum)
Cardiology Consultation:   Patient ID: Bay Wayson; [id number]; Feb 19, 1966   Admit date: 10/17/2018 Date of Consult: 10/18/2018  Primary Care Provider: Patient, No Pcp Per Primary Cardiologist: New  Patient Profile:   Rashaun Wichert is a 53 y.o. male with a hx of hypertension who is being seen today for the evaluation of chest pain and new atrial fibrillation at the request of Dr. Wyline Copas.  History of Present Illness:   Mr. Coolman is a 53 year old male with a history stated above who presented to [facility] from prison on 10/17/2018 with a two-week history of left-sided, nonradiating chest pain which began acutely and was noted to be worse with exertion and while lying flat during this time.  He denies associated symptoms such as diaphoresis, nausea, vomiting and shortness of breath however reports bilateral lower extremity edema which is new for him. He states the last episode of chest pain occurred approximately 2 days prior to admission and is currently pain-free. He reports being under tremendous amounts of stress secondary to being in prison. He states that his chest discomfort will worsen with increased stress. He will attempt to calm himself down, which will typically lessen the pain. Additionally he reports an approximate weight gain of 40lb over the last several months. He denies prior personal or family history of CAD. He reports tobacco use prior to prison but has not smoked in the last 18 months.   Given these complaints, he was transported to the emergency department for further evaluation.  In the ED, CXR with right infrahilar lung nodule, potentially related to vasculature with recommendations for follow-up CT chest without contrast for further evaluation. No pulmonary nodule in the infrahilar portion on CT however there was a 4.5 mm noncalcified right middle lobe nodule and a 4 mm lingular nodule no further follow-up the patient is considered low risk however  if high risk noncontrast chest CT considered in 12 months. Given reports of chest pain, troponin level drawn which was found to be negative x2.  BNP within normal range at 62.1.  EKG revealed atrial fibrillation with rate control.  Recommendations were for hospitalist to admit and cardiology for consultation given new onset of atrial fibrillation.  Cardiology has been asked to evaluate given new onset AF and chest pain.   Past Medical History:  Diagnosis Date  . Hypertension     History reviewed. No pertinent surgical history.   Prior to Admission medications   Medication Sig Start Date End Date Taking? Authorizing Provider  acetaminophen (TYLENOL) 325 MG tablet Take 650 mg by mouth 2 (two) times daily.    Yes [provider]  amLODipine (NORVASC) 5 MG tablet Take 1 tablet (5 mg total) by mouth daily. Patient taking differently: Take 10 mg by mouth daily.  06/13/17  Yes Street, [place], PA-C  aspirin EC 81 MG tablet Take 81 mg by mouth daily.   Yes [provider]  bismuth subsalicylate (PEPTO BISMOL) 262 MG/15ML suspension Take 30 mLs by mouth every 6 (six) hours as needed for diarrhea or loose stools (stomach pain).   Yes [provider]  hydrochlorothiazide (HYDRODIURIL) 25 MG tablet Take 1 tablet (25 mg total) by mouth daily. 06/13/17  Yes Street, [place], PA-C  ibuprofen (ADVIL,MOTRIN) 200 MG tablet Take 600 mg by mouth 2 (two) times daily.    Yes [provider]  metoprolol tartrate (LOPRESSOR) 25 MG tablet Take 25 mg by mouth 2 (two) times daily.   Yes [provider]  metoprolol tartrate (LOPRESSOR) 50 MG tablet Take 50 mg by mouth 2 (two) times daily.   Yes [provider]  ranitidine (ZANTAC) 150 MG tablet Take 1 tablet (150 mg total) by mouth 2 (two) times daily. Patient not taking: Reported on 10/18/2018 06/13/17   Street, [place], [place]    Inpatient Medications: Scheduled Meds: . amLODipine  5 mg Oral Daily  . apixaban  5 mg  Oral BID  . furosemide  40 mg Intravenous Daily  . metoprolol tartrate  25 mg Oral BID   Continuous Infusions:  PRN Meds: acetaminophen **OR** acetaminophen  Allergies:    Allergies  Allergen Reactions  . Penicillins Hives    Has patient had a PCN reaction causing immediate rash, facial/tongue/throat swelling, SOB or lightheadedness with hypotension:Has patient had a PCN reaction causing severe rash involving mucus membranes or skin necrosis:  Has patient had a PCN reaction that required hospitalization  Has patient had a PCN reaction occurring within the last 10 years:  If all of the above answers are "NO", then may proceed with Cephalosporin use.     Social History:   Social History   Socioeconomic History  . Marital status: Unknown    Spouse name: Not on file  . Number of children: Not on file  . Years of education: Not on file  . Highest education level: Not on file  Occupational History  . Not on file  Social Needs  . Financial resource strain: Not on file  . Food insecurity:    Worry: Not on file    Inability: Not on file  . Transportation needs:    Medical: Not on file    Non-medical: Not on file  Tobacco Use  . Smoking status: Current Every Day Smoker    Packs/day: 1.00    Types: Cigarettes  . Smokeless tobacco: Former Network engineer and Sexual Activity  . Alcohol use: No  . Drug use: No  . Sexual activity: Not on file  Lifestyle  . Physical activity:    Days per week: Not on file    Minutes per session: Not on file  . Stress: Not on file  Relationships  . Social connections:    Talks on phone: Not on file    Gets together: Not on file    Attends religious service: Not on file    Active member of club or organization: Not on file    Attends meetings of clubs or organizations: Not on file    Relationship status: Not on file  . Intimate partner violence:    Fear of current or ex partner: Not on file    Emotionally abused: Not on file     Physically abused: Not on file    Forced sexual activity: Not on file  Other Topics Concern  . Not on file  Social History Narrative  . Not on file    Family History:   History reviewed. No pertinent family history. Family Status:  No family status information on file.    ROS:  Please see the history of present illness.  All other ROS reviewed and negative.     Physical Exam/Data:   Vitals:   10/18/18 0345 10/18/18 0500 10/18/18 0700 10/18/18 0900  BP:      Pulse: 86 82 91 90  Resp: 15 14  13   Temp:      TempSrc:      SpO2: 98% 98% 99% 99%  Weight:      Height:  Intake/Output Summary (Last 24 hours) at 10/18/2018 1359 Last data filed at 10/18/2018 1200 Gross per 24 hour  Intake 1200 ml  Output 550 ml  Net 650 ml   Filed Weights   10/17/18 1741 10/18/18 0250  Weight: 123.8 kg 123.4 kg   Body mass index is 34 kg/m.   General: Well developed, well nourished, NAD Skin: Warm, dry, intact  Head: Normocephalic, atraumatic, clear, moist mucus membranes. Neck: Negative for carotid bruits. No JVD Lungs:Clear to ausculation bilaterally. No wheezes, rales, or rhonchi. Breathing is unlabored. Cardiovascular: Irregularly irregular with S1 S2. No murmurs, rubs, gallops, or LV heave appreciated. Abdomen: Soft, non-tender, non-distended with normoactive bowel sounds. No obvious abdominal masses. MSK: Strength and tone appear normal for age. 5/5 in all extremities Extremities: No edema. No clubbing or cyanosis. DP/PT pulses 2+ bilaterally Neuro: Alert and oriented. No focal deficits. No facial asymmetry. MAE spontaneously. Psych: Responds to questions appropriately with normal affect.     EKG:  The EKG was personally reviewed and demonstrates: 10/17/2018 atrial fibrillation with rate control, HR 84 Telemetry:  Telemetry was personally reviewed and demonstrates:   Relevant CV Studies:  ECHO: Pending  CATH: None  Laboratory Data:  Chemistry Recent Labs  Lab  10/17/18 1757 10/18/18 0951  NA 135 135  K 3.7 3.9  CL 100 103  CO2 24 23  GLUCOSE 102* 82  BUN 7 11  CREATININE 0.92 1.14  CALCIUM 9.9 9.4  GFRNONAA >60 >60  GFRAA >60 >60  ANIONGAP 11 9    Total Protein  Date Value Ref Range Status  10/18/2018 8.1 6.5 - 8.1 g/dL Final   Albumin  Date Value Ref Range Status  10/18/2018 3.6 3.5 - 5.0 g/dL Final   AST  Date Value Ref Range Status  10/18/2018 65 (H) 15 - 41 U/L Final   ALT  Date Value Ref Range Status  10/18/2018 82 (H) 0 - 44 U/L Final   Alkaline Phosphatase  Date Value Ref Range Status  10/18/2018 58 38 - 126 U/L Final   Total Bilirubin  Date Value Ref Range Status  10/18/2018 0.8 0.3 - 1.2 mg/dL Final   Hematology Recent Labs  Lab 10/17/18 1757 10/18/18 0951  WBC 10.1 8.3  RBC 5.57 5.48  HGB 17.0 16.8  HCT 49.9 48.9  MCV 89.6 89.2  MCH 30.5 30.7  MCHC 34.1 34.4  RDW 13.5 13.7  PLT 331 339   Cardiac Enzymes Recent Labs  Lab 10/18/18 0320 10/18/18 0951  TROPONINI <0.03 <0.03    Recent Labs  Lab 10/17/18 1803 10/17/18 2228  TROPIPOC 0.00 0.01    BNP Recent Labs  Lab 10/18/18 0320  BNP 62.1    DDimer No results for input(s): DDIMER in the last 168 hours. TSH:  Lab Results  Component Value Date   TSH 1.995 10/18/2018   Lipids: Lab Results  Component Value Date   CHOL 188 12/02/2013   HDL 31 (L) 12/02/2013   LDLCALC 117 (H) 12/02/2013   TRIG 199 (H) 12/02/2013   CHOLHDL 6.1 12/02/2013   HgbA1c:No results found for: HGBA1C  Radiology/Studies:  Dg Chest 2 View  Result Date: 10/17/2018 CLINICAL DATA:  Chest pain EXAM: CHEST - 2 VIEW COMPARISON:  None. FINDINGS: The lungs are clear without focal pneumonia, edema, pneumothorax or pleural effusion. Nodular density identified in the right infrahilar region on the frontal projection. The cardiopericardial silhouette is within normal limits for size. The visualized bony structures of the thorax are intact. IMPRESSION: Right infrahilar  lung  nodule, potentially related to vasculature. CT chest without contrast recommended to further evaluate. Electronically Signed   By: Misty Stanley M.D.   On: 10/17/2018 18:34   Ct Angio Chest Pe W And/or Wo Contrast  Result Date: 10/17/2018 CLINICAL DATA:  Chest pain with intermittent dyspnea x4 days. Infrahilar nodular density seen on same day chest radiograph. EXAM: CT ANGIOGRAPHY CHEST WITH CONTRAST TECHNIQUE: Multidetector CT imaging of the chest was performed using the standard protocol during bolus administration of intravenous contrast. Multiplanar CT image reconstructions and MIPs were obtained to evaluate the vascular anatomy. CONTRAST:  151mL ISOVUE-370 IOPAMIDOL (ISOVUE-370) INJECTION 76% COMPARISON:  Same day CXR FINDINGS: Cardiovascular: Satisfactory opacification of the pulmonary arteries to the segmental level. No evidence of pulmonary embolism. Normal heart size. No pericardial effusion. Nonaneurysmal minimally atherosclerotic thoracic aorta. Mediastinum/Nodes: Borderline enlarged hilar lymph nodes. No mediastinal adenopathy. No enlargement of the axillary lymph nodes. Thyroid gland, trachea, and esophagus demonstrate no significant findings. Lungs/Pleura: Tiny perifissural lymph nodes are suggested bilaterally along the major fissures accounting for subtle nodular densities, the largest on the right measuring 4 mm and on left 2 mm. A 4.5 mm noncalcified right middle lobe nodule, series 7/60 is identified with a 4 mm lingular nodule, series 7/72. Bibasilar dependent atelectasis is seen. There is no pneumothorax or effusion. Upper Abdomen: No acute abnormality. A partially included left interpolar renal cyst measuring up to 2.7 cm is identified. No nephrolithiasis nor obstructive uropathy. Musculoskeletal: No acute nor suspicious osseous abnormality. Subcutaneous nodule is noted along the dorsum of the thorax measuring up to 2.3 cm, nonspecific but possibly large sebaceous cyst. Review of the MIP  images confirms the above findings. IMPRESSION: 1. No acute pulmonary embolus, aortic aneurysm or dissection. 2. No pulmonary nodule in the infrahilar portion of the chest to account for the nodular density seen on the chest radiograph. Findings most likely represented a pulmonary vessel seen on end. 3. 4.5 mm noncalcified right middle lobe nodule and 4 mm lingular nodule. Probable tiny perifissural lymph nodes are noted bilaterally along the major fissures as well. No follow-up needed if patient is low-risk (and has no known or suspected primary neoplasm). Non-contrast chest CT can be considered in 12 months if patient is high-risk. This recommendation follows the consensus statement: Guidelines for Management of Incidental Pulmonary Nodules Detected on CT Images: From the Fleischner Society 2017; Radiology 2017; 284:228-243. 4. Subcutaneous nodule measuring up 2.3 cm along the posterior aspect of the thorax. Findings might represent a sebaceous cyst but is nonspecific on CT. Aortic Atherosclerosis (ICD10-I70.0). Electronically Signed   By: Ashley Royalty M.D.   On: 10/17/2018 23:14   Assessment and Plan:   1.  New onset atrial fibrillation: -Patient presented from prison with acute onset of left sided, non radiating chest discomfort which began acutely and was noted to be worse with exertion and while lying flat.  He denies associated symptoms such as diaphoresis, nausea, vomiting and shortness of breath however reports bilateral lower extremity edema which is new for him.  -EKG on presentation showed atrial fibrillation with controlled rate -Troponin, negative x2 -Per telemetry review, he remains in AF with rates in the 80-90's  -TSH, 1.99 within normal limits -Eliquis 5 mg twice daily started in the ED per recommendation by cardiology -Continue metoprolol 25mg  twice daily for HR BP control, on home dose 75mg  twice daily however BP likely will not tolerate that dose at this time  -Echocardiogram,  pending -Plan for TEE/DCCV Monday given that  he is here from prison making return to the office difficult -CHA2DS2VASc= 2  2.  Chest pain: -Patient presented with left sided exertional and stress induced chest discomfort. No other associated symptoms and he has ruled out for ACS at this point. Likely  in the setting of atrial fibrillation, however awaiting echo results to assess for abnormalities  -No prior cardiac history>>>no recurrent symptoms  -Troponin negative x2 with no EKG changes and no recurrent ACS symptoms -Will obtain echocardiogram to assess for wall motion abnormalities for LV dysfunction -If abnormal, can consider stress test for further ischemic evaluation -Continue to trend troponin and monitor EKG for acute changes  3.  HTN: -Stable, 115/88, 101/56, 126/76, 130/73 -Continue metoprolol 25 mg twice daily, amlodipine 5 mg daily -Was on home metoprolol 75 mg twice daily and hydrochlorothiazide 25 mg daily   4.  BLE edema: -Patient reports ongoing exertional shortness of breath with a 40 pound weight gain -BNP on presentation negative at 62.1 -No pulmonary edema per CXR -IV Lasix 40 mg daily started per EDP -Weight, 272lb>>states his baseline weight is approximately 240lb -I&O, net +650 mL since hospital admission -Monitor daily BMET   For questions or updates, please contact [facility] Please consult [email or web address] for contact info under Cardiology/STEMI.   SignedKathyrn Drown NP-C [facility] Pager: [phone] 10/18/2018 1:59 PM  ---------------------------------------------------------------------------------------------   History and all data above reviewed.  Patient examined.  I agree with the findings as above.  Talbert Gretta Cool presented with chest pain and has ruled out for ACS but was found to be in rate controlled atrial fibrillation.  This is new for him, but he endorses the symptoms over the past 2 weeks.  He is currently chest  pain-free, denies palpitations and shortness of breath.  Constitutional: No acute distress Eyes: pupils equally round and reactive to light, sclera non-icteric, normal conjunctiva and lids ENMT: normal dentition, moist mucous membranes Cardiovascular: regular rhythm, normal rate, no murmurs. S1 and S2 normal. Radial pulses normal bilaterally. No jugular venous distention.  Respiratory: clear to auscultation bilaterally GI : normal bowel sounds, soft and nontender. No distention.   MSK: extremities warm, well perfused. No edema.  NEURO: grossly nonfocal exam, moves all extremities. PSYCH: alert and oriented x 3, normal mood and affect.   All available labs, radiology testing, previous records reviewed. Agree with documented assessment and plan of my colleague as stated above with the following additions or changes:  Principal Problem:   Chest pain in adult Active Problems:   HTN (hypertension)   New onset a-fib ([place])   Chest pain    Plan: We will plan for TEE guided cardioversion on Monday.  If the patient spontaneously converts, we can consider hospital dismissal as he is on anticoagulation and rate control agent.  Ejection fraction on echocardiogram performed today is normal.   Length of Stay:  LOS: 0 days   Elouise Munroe, MD [facility] 4:34 PM  10/18/2018

## 2018-10-18 NOTE — Progress Notes (Signed)
  Echocardiogram 2D Echocardiogram has been performed.  Bobbye Charleston 10/18/2018, 2:39 PM

## 2018-10-18 NOTE — H&P (Signed)
History and Physical    Seth Mcdaniel YCX:[id number] DOB: [birth year] DOA: 10/17/2018  PCP: Patient, No Pcp Per  Patient coming from: [place]  Chief Complaint: Chest pain  HPI: Seth Mcdaniel is a 53 y.o. male with medical history significant of HTN presents from prison with 2 weeks of L sided, non-radiating chest pain. Patient states symptom "just started" and noted to be worse with moderate exertion such as walking briskly. During this time, patient also noticed new swelling involving both LE. Denies sob or nausea. States last bout of chest pain was two days prior to ED visit. Currently chest pain free. Of note patient reports approximately 40 pound wt gain. Denies orthopnea. Given these complaints, pt was brought to ED for further eval  ED Course: In the ED, pt underwent CXR and CT chest with findings of no PE, 4.22mm noncalcified RML nodule and 63mm lingular nodule. Trop noted to be neg x2. Cardiology called by EDP who had recommended starting oral anticoagulation, TTE, and observation overnight. Hospitalist consulted for consideration for admission.  Review of Systems:  Review of Systems  Constitutional: Negative for chills, fever and weight loss.  HENT: Negative for congestion, ear discharge, ear pain and tinnitus.   Eyes: Negative for double vision, photophobia and pain.  Respiratory: Negative for hemoptysis, sputum production, shortness of breath and wheezing.   Cardiovascular: Positive for chest pain and leg swelling. Negative for orthopnea and claudication.  Gastrointestinal: Negative for abdominal pain, heartburn, nausea and vomiting.  Genitourinary: Negative for flank pain, frequency and urgency.  Musculoskeletal: Negative for back pain, falls, joint pain and neck pain.  Neurological: Negative for tremors, sensory change, focal weakness, seizures and loss of consciousness.  Psychiatric/Behavioral: Negative for hallucinations. The patient is not nervous/anxious  and does not have insomnia.     Past Medical History:  Diagnosis Date  . Hypertension     History reviewed. No pertinent surgical history.   reports that he has been smoking cigarettes. He has been smoking about 1.00 pack per day. He has quit using smokeless tobacco. He reports that he does not drink alcohol or use drugs.   History reviewed. No pertinent family history.  Diabetes "in my family"  Prior to Admission medications   Medication Sig Start Date End Date Taking? Authorizing Provider  acetaminophen (TYLENOL) 500 MG tablet Take 1,500 mg by mouth every 6 (six) hours as needed for mild pain.    [provider]  amLODipine (NORVASC) 5 MG tablet Take 1 tablet (5 mg total) by mouth daily. 06/13/17   Street, [place], PA-C  bismuth subsalicylate (PEPTO BISMOL) 262 MG/15ML suspension Take 30 mLs by mouth every 6 (six) hours as needed for diarrhea or loose stools (stomach pain).    [provider]  hydrochlorothiazide (HYDRODIURIL) 25 MG tablet Take 1 tablet (25 mg total) by mouth daily. Patient not taking: Reported on 06/13/2017 07/12/15   Harvel Quale, MD  hydrochlorothiazide (HYDRODIURIL) 25 MG tablet Take 1 tablet (25 mg total) by mouth daily. 06/13/17   Street, [place], PA-C  ibuprofen (ADVIL,MOTRIN) 200 MG tablet Take 600 mg by mouth every 6 (six) hours as needed for mild pain.    [provider]  ondansetron (ZOFRAN) 8 MG tablet Take 1 tablet (8 mg total) by mouth every 8 (eight) hours as needed for nausea or vomiting. 06/13/17   Street, [place], PA-C  ranitidine (ZANTAC) 150 MG tablet Take 1 tablet (150 mg total) by mouth 2 (two) times daily. 06/13/17   Street, [place],  PA-C    Physical Exam: Vitals:   10/17/18 1737 10/17/18 1741 10/17/18 2315 10/18/18 0000  BP: (!) 153/107  (!) 135/98 (!) 138/110  Pulse: 80  62 77  Resp: 16  14 15   Temp: (!) 97.5 F (36.4 C)     TempSrc: Oral     SpO2: 100%  99% 95%  Weight:  123.8 kg    Height:  6\' 3"  (1.905 m)       Constitutional: NAD, calm, comfortable Vitals:   10/17/18 1737 10/17/18 1741 10/17/18 2315 10/18/18 0000  BP: (!) 153/107  (!) 135/98 (!) 138/110  Pulse: 80  62 77  Resp: 16  14 15   Temp: (!) 97.5 F (36.4 C)     TempSrc: Oral     SpO2: 100%  99% 95%  Weight:  123.8 kg    Height:  6\' 3"  (1.905 m)     Eyes: PERRL, lids and conjunctivae normal ENMT: Mucous membranes are moist. Posterior pharynx clear of any exudate or lesions.Normal dentition.  Neck: normal, supple, no masses, no thyromegaly Respiratory: clear to auscultation bilaterally, no wheezing, no crackles. Normal respiratory effort. No accessory muscle use.  Cardiovascular: irregularly irregular, s1, s2 Abdomen: no tenderness, no masses palpated. No hepatosplenomegaly. Bowel sounds positive.  Musculoskeletal: no clubbing / cyanosis. No joint deformity upper and lower extremities. Good ROM, no contractures. Normal muscle tone, BLE edema  Skin: no rashes, lesions, ulcers. No induration Neurologic: CN 2-12 grossly intact. Sensation intact, DTR normal. Strength 5/5 in all 4.  Psychiatric: Normal judgment and insight. Alert and oriented x 3. Normal mood.    Labs on Admission: I have personally reviewed following labs and imaging studies  CBC: Recent Labs  Lab 10/17/18 1757  WBC 10.1  HGB 17.0  HCT 49.9  MCV 89.6  PLT 782   Basic Metabolic Panel: Recent Labs  Lab 10/17/18 1757 10/17/18 2246  NA 135  --   K 3.7  --   CL 100  --   CO2 24  --   GLUCOSE 102*  --   BUN 7  --   CREATININE 0.92  --   CALCIUM 9.9  --   MG  --  2.3   GFR: Estimated Creatinine Clearance: 136.1 mL/min (by C-G formula based on SCr of 0.92 mg/dL). Liver Function Tests: No results for input(s): AST, ALT, ALKPHOS, BILITOT, PROT, ALBUMIN in the last 168 hours. No results for input(s): LIPASE, AMYLASE in the last 168 hours. No results for input(s): AMMONIA in the last 168 hours. Coagulation Profile: Recent Labs  Lab 10/17/18 2116    INR 1.06   Cardiac Enzymes: No results for input(s): CKTOTAL, CKMB, CKMBINDEX, TROPONINI in the last 168 hours. BNP (last 3 results) No results for input(s): PROBNP in the last 8760 hours. HbA1C: No results for input(s): HGBA1C in the last 72 hours. CBG: No results for input(s): GLUCAP in the last 168 hours. Lipid Profile: No results for input(s): CHOL, HDL, LDLCALC, TRIG, CHOLHDL, LDLDIRECT in the last 72 hours. Thyroid Function Tests: No results for input(s): TSH, T4TOTAL, FREET4, T3FREE, THYROIDAB in the last 72 hours. Anemia Panel: No results for input(s): VITAMINB12, FOLATE, FERRITIN, TIBC, IRON, RETICCTPCT in the last 72 hours. Urine analysis:    Component Value Date/Time   COLORURINE COLORLESS (A) 06/13/2017 1315   APPEARANCEUR CLEAR 06/13/2017 1315   LABSPEC 1.008 06/13/2017 1315   PHURINE 8.0 06/13/2017 1315   GLUCOSEU 50 (A) 06/13/2017 1315   HGBUR SMALL (A) 06/13/2017 1315  BILIRUBINUR NEGATIVE 06/13/2017 1315   KETONESUR NEGATIVE 06/13/2017 1315   PROTEINUR 100 (A) 06/13/2017 1315   UROBILINOGEN 0.2 12/02/2013 1631   NITRITE NEGATIVE 06/13/2017 1315   LEUKOCYTESUR NEGATIVE 06/13/2017 1315   Sepsis Labs: !!!!!!!!!!!!!!!!!!!!!!!!!!!!!!!!!!!!!!!!!!!! @LABRCNTIP (procalcitonin:4,lacticidven:4) )No results found for this or any previous visit (from the past 240 hour(s)).   Radiological Exams on Admission: Dg Chest 2 View  Result Date: 10/17/2018 CLINICAL DATA:  Chest pain EXAM: CHEST - 2 VIEW COMPARISON:  None. FINDINGS: The lungs are clear without focal pneumonia, edema, pneumothorax or pleural effusion. Nodular density identified in the right infrahilar region on the frontal projection. The cardiopericardial silhouette is within normal limits for size. The visualized bony structures of the thorax are intact. IMPRESSION: Right infrahilar lung nodule, potentially related to vasculature. CT chest without contrast recommended to further evaluate. Electronically Signed    By: Misty Stanley M.D.   On: 10/17/2018 18:34   Ct Angio Chest Pe W And/or Wo Contrast  Result Date: 10/17/2018 CLINICAL DATA:  Chest pain with intermittent dyspnea x4 days. Infrahilar nodular density seen on same day chest radiograph. EXAM: CT ANGIOGRAPHY CHEST WITH CONTRAST TECHNIQUE: Multidetector CT imaging of the chest was performed using the standard protocol during bolus administration of intravenous contrast. Multiplanar CT image reconstructions and MIPs were obtained to evaluate the vascular anatomy. CONTRAST:  167mL ISOVUE-370 IOPAMIDOL (ISOVUE-370) INJECTION 76% COMPARISON:  Same day CXR FINDINGS: Cardiovascular: Satisfactory opacification of the pulmonary arteries to the segmental level. No evidence of pulmonary embolism. Normal heart size. No pericardial effusion. Nonaneurysmal minimally atherosclerotic thoracic aorta. Mediastinum/Nodes: Borderline enlarged hilar lymph nodes. No mediastinal adenopathy. No enlargement of the axillary lymph nodes. Thyroid gland, trachea, and esophagus demonstrate no significant findings. Lungs/Pleura: Tiny perifissural lymph nodes are suggested bilaterally along the major fissures accounting for subtle nodular densities, the largest on the right measuring 4 mm and on left 2 mm. A 4.5 mm noncalcified right middle lobe nodule, series 7/60 is identified with a 4 mm lingular nodule, series 7/72. Bibasilar dependent atelectasis is seen. There is no pneumothorax or effusion. Upper Abdomen: No acute abnormality. A partially included left interpolar renal cyst measuring up to 2.7 cm is identified. No nephrolithiasis nor obstructive uropathy. Musculoskeletal: No acute nor suspicious osseous abnormality. Subcutaneous nodule is noted along the dorsum of the thorax measuring up to 2.3 cm, nonspecific but possibly large sebaceous cyst. Review of the MIP images confirms the above findings. IMPRESSION: 1. No acute pulmonary embolus, aortic aneurysm or dissection. 2. No pulmonary  nodule in the infrahilar portion of the chest to account for the nodular density seen on the chest radiograph. Findings most likely represented a pulmonary vessel seen on end. 3. 4.5 mm noncalcified right middle lobe nodule and 4 mm lingular nodule. Probable tiny perifissural lymph nodes are noted bilaterally along the major fissures as well. No follow-up needed if patient is low-risk (and has no known or suspected primary neoplasm). Non-contrast chest CT can be considered in 12 months if patient is high-risk. This recommendation follows the consensus statement: Guidelines for Management of Incidental Pulmonary Nodules Detected on CT Images: From the Fleischner Society 2017; Radiology 2017; 284:228-243. 4. Subcutaneous nodule measuring up 2.3 cm along the posterior aspect of the thorax. Findings might represent a sebaceous cyst but is nonspecific on CT. Aortic Atherosclerosis (ICD10-I70.0). Electronically Signed   By: Ashley Royalty M.D.   On: 10/17/2018 23:14    EKG: Independently reviewed. Afib  Assessment/Plan Principal Problem:   Chest pain in adult Active Problems:  HTN (hypertension)   New onset a-fib ([place])   1. Chest pain 1. Thus far trop neg x 2 2. Will follow serial trop 3. HEART score of 2 4. Continue with analgesic as needed 5. EDP discussed with Cardiology who recommended 2d echo, ordered as well as observation overnight 2. Afib, new diagnosis 1. CHADS-VASc of 1 (HTN) 2. EKG reviewed, findings of afib, rate controlled 3. TSH ordered, pending 4. 2d echo per above 5. Eliquis started in ED per recommendation by Cardiology 6. Would continue pt on metoprolol for BP and rate control 7. Continue on tele 3. HTN 1. BP currently stable, albeit suboptimally controlled 2. Start metoprolol per above 3. Cont norvasc per home regimen, start lasix in lieu of HCTZ per below 4. BLE edema 1. 2d echo ordered per above 2. Given new edema with reported wt gain, suspicion for new CHF 3. Will  start lasix 4. BNP ordered, pending 5. Check daily wt and I/o 6. TSH ordered per above 7. CXR reviewed, no evidence of pulm edema  DVT prophylaxis: Eliquis  Code Status: Full Family Communication: Pt in room  Disposition Plan: Uncertain at this time  Consults called:  Admission status: Observation as would likely require less than 2 midnight stay to rule out MI   Marylu Lund MD [facility] Pager On Amion  If 7PM-7AM, please contact night-coverage  10/18/2018, 12:17 AM

## 2018-10-19 DIAGNOSIS — I10 Essential (primary) hypertension: Secondary | ICD-10-CM

## 2018-10-19 DIAGNOSIS — R600 Localized edema: Secondary | ICD-10-CM

## 2018-10-19 LAB — CBC
HCT: 48.7 % (ref 39.0–52.0)
Hemoglobin: 16.4 g/dL (ref 13.0–17.0)
MCH: 30.7 pg (ref 26.0–34.0)
MCHC: 33.7 g/dL (ref 30.0–36.0)
MCV: 91.2 fL (ref 80.0–100.0)
Platelets: 309 10*3/uL (ref 150–400)
RBC: 5.34 MIL/uL (ref 4.22–5.81)
RDW: 14.1 % (ref 11.5–15.5)
WBC: 7.9 10*3/uL (ref 4.0–10.5)
nRBC: 0 % (ref 0.0–0.2)

## 2018-10-19 LAB — BASIC METABOLIC PANEL
Anion gap: 9 (ref 5–15)
BUN: 11 mg/dL (ref 6–20)
CO2: 24 mmol/L (ref 22–32)
CREATININE: 0.95 mg/dL (ref 0.61–1.24)
Calcium: 9 mg/dL (ref 8.9–10.3)
Chloride: 103 mmol/L (ref 98–111)
GFR calc non Af Amer: >60 mL/min (ref >60)
Glucose, Bld: 155 mg/dL — ABNORMAL HIGH (ref 70–99)
Potassium: 3.7 mmol/L (ref 3.5–5.1)
Sodium: 136 mmol/L (ref 135–145)

## 2018-10-19 LAB — MRSA PCR SCREENING: MRSA by PCR: NEGATIVE

## 2018-10-19 MED ORDER — FUROSEMIDE 40 MG PO TABS
40.0000 mg | ORAL_TABLET | Freq: Every day | ORAL | Status: DC
  Administered 2018-10-20 – 2018-10-21 (×2): 40 mg via ORAL
  Filled 2018-10-19 (×2): qty 1

## 2018-10-19 MED ORDER — FUROSEMIDE 40 MG PO TABS: 40.0000 mg | ORAL_TABLET | Freq: Every day | ORAL | Status: DC

## 2018-10-19 NOTE — Progress Notes (Signed)
Interval CV note: patient still in atrial fibrillation. Continue to plan for TEE guided DCCV on Monday.   Will see as needed over weekend.

## 2018-10-19 NOTE — H&P (View-Only) (Signed)
Interval CV note: patient still in atrial fibrillation. Continue to plan for TEE guided DCCV on Monday.   Will see as needed over weekend.

## 2018-10-19 NOTE — Progress Notes (Signed)
PROGRESS NOTE  Seth Mcdaniel XKG:[id number] DOB: 03-Dec-1965 DOA: 10/17/2018 PCP: Patient, No Pcp Per  HPI/Brief Narrative  Seth Mcdaniel is a 53 y.o. year old male with medical history significant for hypertension who presented from prison on 10/17/2018 with 2 weeks of nonradiating, left-sided chest pain worse with exertion and new lower extremity swelling and reported 40 pound weight gain and was found to have new onset atrial fibrillation.  In evaluation of chest pain chest x-ray was unremarkable, CT chest with no acute PE, and troponin trend x2 was negative.  Cardiology started patient on Eliquis and currently monitoring for spontaneous conversion otherwise plan TEE with cardioversion on 1/13.  Subjective Says he tends to feel anxious when his heart rate goes up Denies any chest pain Reports improvement in his lower leg swelling  Assessment/Plan:  Atrial fibrillation, new onset, rate controlled Continue Lopressor 25 mg twice daily, on Eliquis we will continue to monitor, plan for TEE with cardioversion by cardiology on Monday unless patient self converts.  Chads vascular score (1, unable to determine diastolic dysfunction on recent TTE) will monitor telemetry.  Chest pain, resolved Likely chest discomfort in the setting of atrial fibrillation, no longer experiencing.  Initial troponin was negative and nonischemic EKG  Lower extremity edema Preserved EF on TTE, BMP unremarkable, chest x-ray with no vascular congestion or pulmonary edema, unable to assess diastolic dysfunction, very possible swelling occurred related to atrial fibrillation.  No edema on exam today.  We will continue Lasix but de-escalate to oral and closely monitor weights, ins and outs, daily BMP.  Hypertension, currently at goal Was previously on Lopressor at 75 mg 3 times daily, tolerating lower dose at 25-day, amlodipine 5 mg started during hospital stay for better control.  Holding home HCTZ  given currently on Lasix, will closely monitor.     Telemetry: Yes  DVT prophylaxis: Consultants:   Treatment Team:   Lbcardiology, [place], MD   Procedures: TTE, 10/18/2018.  EF 60-65%, no wall motion abnormalities, unable to evaluate LV diastolic dysfunction due to atrial fibrillation. Antimicrobials:  Code Status: Full code  Family Communication: No family at bedside  Disposition Plan: Monitor on telemetry, continue rate control and anticoagulation also plan TEE cardioversion with cardiology on 1/13        Objective: Vitals:   10/18/18 1700 10/18/18 1943 10/19/18 0352 10/19/18 0738  BP:  (!) 113/91 (!) 126/97 115/84  Pulse:  81 93 98  Resp: 15 15 14  (!) 21  Temp:  97.6 F (36.4 C) 97.8 F (36.6 C) 98.8 F (37.1 C)  TempSrc:  Oral Oral Oral  SpO2:  99% 97% 98%  Weight:   122.4 kg   Height:        Intake/Output Summary (Last 24 hours) at 10/19/2018 1004 Last data filed at 10/18/2018 1630 Gross per 24 hour  Intake 840 ml  Output -  Net 840 ml   Filed Weights   10/17/18 1741 10/18/18 0250 10/19/18 0352  Weight: 123.8 kg 123.4 kg 122.4 kg    Exam:  Constitutional:normal appearing male, no distress Eyes: EOMI, anicteric, normal conjunctivae ENMT: Oropharynx with moist mucous membranes,  Neck: FROM Cardiovascular: Irregularly irregular rhythm, normal rate no MRGs, with no peripheral edema Respiratory: Normal respiratory effort on room air, clear breath sounds  Abdomen: Soft,non-tender, normal bowel sounds Skin: No rash ulcers, or lesions. Without skin tenting  Neurologic: Grossly no focal neuro deficit. Psychiatric:Appropriate affect, and mood. Mental status AAOx3  Data Reviewed: CBC: Recent Labs  Lab 10/17/18 1757 10/18/18 0951 10/19/18 0809  WBC 10.1 8.3 7.9  HGB 17.0 16.8 16.4  HCT 49.9 48.9 48.7  MCV 89.6 89.2 91.2  PLT 331 339 888   Basic Metabolic Panel: Recent Labs  Lab 10/17/18 1757 10/17/18 2246 10/18/18 0951 10/19/18 0809    NA 135  --  135 136  K 3.7  --  3.9 3.7  CL 100  --  103 103  CO2 24  --  23 24  GLUCOSE 102*  --  82 155*  BUN 7  --  11 11  CREATININE 0.92  --  1.14 0.95  CALCIUM 9.9  --  9.4 9.0  MG  --  2.3  --   --    GFR: Estimated Creatinine Clearance: 131.2 mL/min (by C-G formula based on SCr of 0.95 mg/dL). Liver Function Tests: Recent Labs  Lab 10/18/18 0951  AST 65*  ALT 82*  ALKPHOS 58  BILITOT 0.8  PROT 8.1  ALBUMIN 3.6   No results for input(s): LIPASE, AMYLASE in the last 168 hours. No results for input(s): AMMONIA in the last 168 hours. Coagulation Profile: Recent Labs  Lab 10/17/18 2116  INR 1.06   Cardiac Enzymes: Recent Labs  Lab 10/18/18 0320 10/18/18 0951 10/18/18 1636  TROPONINI <0.03 <0.03 <0.03   BNP (last 3 results) No results for input(s): PROBNP in the last 8760 hours. HbA1C: No results for input(s): HGBA1C in the last 72 hours. CBG: No results for input(s): GLUCAP in the last 168 hours. Lipid Profile: No results for input(s): CHOL, HDL, LDLCALC, TRIG, CHOLHDL, LDLDIRECT in the last 72 hours. Thyroid Function Tests: Recent Labs    10/18/18 0052  TSH 1.995   Anemia Panel: No results for input(s): VITAMINB12, FOLATE, FERRITIN, TIBC, IRON, RETICCTPCT in the last 72 hours. Urine analysis:    Component Value Date/Time   COLORURINE COLORLESS (A) 06/13/2017 1315   APPEARANCEUR CLEAR 06/13/2017 1315   LABSPEC 1.008 06/13/2017 1315   PHURINE 8.0 06/13/2017 1315   GLUCOSEU 50 (A) 06/13/2017 1315   HGBUR SMALL (A) 06/13/2017 1315   BILIRUBINUR NEGATIVE 06/13/2017 1315   KETONESUR NEGATIVE 06/13/2017 1315   PROTEINUR 100 (A) 06/13/2017 1315   UROBILINOGEN 0.2 12/02/2013 1631   NITRITE NEGATIVE 06/13/2017 1315   LEUKOCYTESUR NEGATIVE 06/13/2017 1315   Sepsis Labs: @LABRCNTIP (procalcitonin:4,lacticidven:4)  )No results found for this or any previous visit (from the past 240 hour(s)).    Studies: No results found.  Scheduled Meds: .  amLODipine  5 mg Oral Daily  . apixaban  5 mg Oral BID  . furosemide  40 mg Intravenous Daily  . metoprolol tartrate  25 mg Oral BID  . sodium chloride flush  3 mL Intravenous Q12H    Continuous Infusions: . sodium chloride       LOS: 1 day     Desiree Hane, MD [facility] Pager [phone]  If 7PM-7AM, please contact night-coverage [email or web address] Password [email or web address] 10/19/2018, 10:04 AM

## 2018-10-19 NOTE — Progress Notes (Signed)
I have personally reviewed patient's H&P and agree with the overall plan and assessment.  Patient reports that shortness of breath symptoms started all of a sudden yesterday and then he estimates he has gained 30 pounds or more.  Cardiac enzymes have been negative x2.  Patient had received full dose aspirin.  Patient was seen by cardiology and they agreed with continuation of Eliquis for new onset atrial fibrillation with patient's CHA2DS2-VASc score = 2.  Plan is for TEE/DCCV on 1/13.  Troponins have been negative throughout the day.  Cardiology changed him from metoprolol 75 mg twice daily along with hydrochlorothiazide 25 mg daily to metoprolol 25 mg twice daily and amlodipine 5 mg daily.  Currently patient has trace lower extremity leg edema, lungs sound clear, and still irregular irregular heart rhythm at a rate of 91 bpm.

## 2018-10-20 LAB — BASIC METABOLIC PANEL
Anion gap: 7 (ref 5–15)
BUN: 11 mg/dL (ref 6–20)
CO2: 25 mmol/L (ref 22–32)
Calcium: 9.1 mg/dL (ref 8.9–10.3)
Chloride: 105 mmol/L (ref 98–111)
Creatinine, Ser: 1.16 mg/dL (ref 0.61–1.24)
GFR calc Af Amer: >60 mL/min (ref >60)
GFR calc non Af Amer: >60 mL/min (ref >60)
GLUCOSE: 103 mg/dL — AB (ref 70–99)
POTASSIUM: 3.6 mmol/L (ref 3.5–5.1)
Sodium: 137 mmol/L (ref 135–145)

## 2018-10-20 MED ORDER — ZOLPIDEM TARTRATE 5 MG PO TABS: 5.0000 mg | ORAL_TABLET | Freq: Every evening | ORAL | Status: DC | PRN

## 2018-10-20 NOTE — Anesthesia Preprocedure Evaluation (Addendum)
Anesthesia Evaluation  Patient identified by MRN, date of birth, ID band Patient awake    Reviewed: Allergy & Precautions, NPO status , Patient's Chart, lab work & pertinent test results  History of Anesthesia Complications Negative for: history of anesthetic complications  Airway Mallampati: II  TM Distance: >3 FB Neck ROM: Full    Dental no notable dental hx. (+) Dental Advisory Given   Pulmonary Current Smoker,    Pulmonary exam normal        Cardiovascular hypertension, Pt. on medications and Pt. on home beta blockers  Rhythm:Irregular Rate:Tachycardia     Neuro/Psych negative neurological ROS     GI/Hepatic negative GI ROS, Neg liver ROS,   Endo/Other  negative endocrine ROS  Renal/GU negative Renal ROS     Musculoskeletal negative musculoskeletal ROS (+)   Abdominal   Peds  Hematology negative hematology ROS (+)   Anesthesia Other Findings Day of surgery medications reviewed with the patient.  Reproductive/Obstetrics                            Anesthesia Physical Anesthesia Plan  ASA: III  Anesthesia Plan: MAC   Post-op Pain Management:    Induction: Intravenous  PONV Risk Score and Plan: Propofol infusion and Ondansetron  Airway Management Planned: Natural Airway and Simple Face Mask  Additional Equipment:   Intra-op Plan:   Post-operative Plan:   Informed Consent: I have reviewed the patients History and Physical, chart, labs and discussed the procedure including the risks, benefits and alternatives for the proposed anesthesia with the patient or authorized representative who has indicated his/her understanding and acceptance.   Dental advisory given  Plan Discussed with: CRNA and Anesthesiologist  Anesthesia Plan Comments:        Anesthesia Quick Evaluation

## 2018-10-20 NOTE — Progress Notes (Signed)
Interval CV note: Remains in atrial fibrillation, plan for TEE guided cardioversion Monday.   Patient was consented during the initial consultation on 1/10/12020:  After careful review of history and examination, the risks and benefits of transesophageal echocardiogram have been explained including risks of esophageal damage, perforation (1:10,000 risk), bleeding, pharyngeal hematoma as well as other potential complications associated with conscious sedation including aspiration, arrhythmia, respiratory failure and death. Alternatives to treatment were discussed, questions were answered. Patient is willing to proceed.

## 2018-10-20 NOTE — Progress Notes (Signed)
PROGRESS NOTE  Seth Mcdaniel DJS:[id number] DOB: 1966/07/02 DOA: 10/17/2018 PCP: Patient, No Pcp Per  HPI/Brief Narrative  Seth Mcdaniel is a 53 y.o. year old male with medical history significant for hypertension who [facility] from prison on 10/17/2018 with 2 weeks of nonradiating, left-sided chest pain worse with exertion and new lower extremity swelling and reported 40 pound weight gain and was found to have new onset atrial fibrillation.  In evaluation of chest pain chest x-ray was unremarkable, CT chest with no acute PE, and troponin trend x2 was negative.  Cardiology started patient on Eliquis and currently monitoring for spontaneous conversion otherwise plan TEE with cardioversion on 1/13.  Subjective Continues to report anxiety, diminished sleep overnight.  No chest pain no shortness of breath.  Reports continued improvement in lower leg swelling  Assessment/Plan:  Atrial fibrillation, new onset, rate controlled Continue Lopressor 25 mg twice daily, on Eliquis we will continue to monitor, plan for TEE with cardioversion by cardiology on Monday unless patient self converts.  Chads vascular score (1, unable to determine diastolic dysfunction on recent TTE) will monitor telemetry.  Chest pain, resolved Likely chest discomfort in the setting of atrial fibrillation, no longer experiencing.  Initial troponin was negative and nonischemic EKG  Lower extremity edema, improved Preserved EF on TTE, BMP unremarkable, chest x-ray with no vascular congestion or pulmonary edema, unable to assess diastolic dysfunction, very possible swelling occurred related to atrial fibrillation.  Continues to have trace edema.  On oral Lasix and closely monitor weights, ins and outs, daily BMP.  Hypertension, currently at goal Was previously on Lopressor at 75 mg 3 times daily, tolerating lower dose at 25-day, amlodipine 5 mg started during hospital stay for better control.  Holding home  HCTZ given currently on Lasix, will closely monitor.     Telemetry: Yes  DVT prophylaxis: Consultants:  Treatment Team:   [facility], [place], MD   Procedures: TTE, 10/18/2018.  EF 60-65%, no wall motion abnormalities, unable to evaluate LV diastolic dysfunction due to atrial fibrillation. Antimicrobials:  Code Status: Full code  Family Communication: No family at bedside  Disposition Plan: Monitor on telemetry, continue rate control and anticoagulation also plan TEE cardioversion with cardiology on 1/13, n.p.o. at midnight        Objective: Vitals:   10/19/18 0830 10/19/18 1200 10/19/18 2138 10/20/18 0510  BP:  122/88 112/82 128/77  Pulse:  93    Resp: 18 15 14 14   Temp:  98.4 F (36.9 C) 97.6 F (36.4 C) 98 F (36.7 C)  TempSrc:  Oral Oral Oral  SpO2:  98% 100% 100%  Weight:      Height:        Intake/Output Summary (Last 24 hours) at 10/20/2018 0824 Last data filed at 10/19/2018 1457 Gross per 24 hour  Intake -  Output 720 ml  Net -720 ml   Filed Weights   10/17/18 1741 10/18/18 0250 10/19/18 0352  Weight: 123.8 kg 123.4 kg 122.4 kg    Exam:  Constitutional:normal appearing male, no distress Eyes: EOMI, anicteric, normal conjunctivae ENMT: Oropharynx with moist mucous membranes,  Neck: FROM Cardiovascular: Irregularly irregular rhythm, normal rate no MRGs, with no peripheral edema Respiratory: Normal respiratory effort on room air, clear breath sounds  Abdomen: Soft,non-tender, normal bowel sounds Skin: No rash ulcers, or lesions. Without skin tenting  Neurologic: Grossly no focal neuro deficit. Psychiatric:Appropriate affect, and mood. Mental status AAOx3  Data Reviewed: CBC: Recent Labs  Lab 10/17/18 1757 10/18/18 0951 10/19/18  0809  WBC 10.1 8.3 7.9  HGB 17.0 16.8 16.4  HCT 49.9 48.9 48.7  MCV 89.6 89.2 91.2  PLT 331 339 937   Basic Metabolic Panel: Recent Labs  Lab 10/17/18 1757 10/17/18 2246 10/18/18 0951 10/19/18 0809  10/20/18 0251  NA 135  --  135 136 137  K 3.7  --  3.9 3.7 3.6  CL 100  --  103 103 105  CO2 24  --  23 24 25   GLUCOSE 102*  --  82 155* 103*  BUN 7  --  11 11 11   CREATININE 0.92  --  1.14 0.95 1.16  CALCIUM 9.9  --  9.4 9.0 9.1  MG  --  2.3  --   --   --    GFR: Estimated Creatinine Clearance: 107.4 mL/min (by C-G formula based on SCr of 1.16 mg/dL). Liver Function Tests: Recent Labs  Lab 10/18/18 0951  AST 65*  ALT 82*  ALKPHOS 58  BILITOT 0.8  PROT 8.1  ALBUMIN 3.6   No results for input(s): LIPASE, AMYLASE in the last 168 hours. No results for input(s): AMMONIA in the last 168 hours. Coagulation Profile: Recent Labs  Lab 10/17/18 2116  INR 1.06   Cardiac Enzymes: Recent Labs  Lab 10/18/18 0320 10/18/18 0951 10/18/18 1636  TROPONINI <0.03 <0.03 <0.03   BNP (last 3 results) No results for input(s): PROBNP in the last 8760 hours. HbA1C: No results for input(s): HGBA1C in the last 72 hours. CBG: No results for input(s): GLUCAP in the last 168 hours. Lipid Profile: No results for input(s): CHOL, HDL, LDLCALC, TRIG, CHOLHDL, LDLDIRECT in the last 72 hours. Thyroid Function Tests: Recent Labs    10/18/18 0052  TSH 1.995   Anemia Panel: No results for input(s): VITAMINB12, FOLATE, FERRITIN, TIBC, IRON, RETICCTPCT in the last 72 hours. Urine analysis:    Component Value Date/Time   COLORURINE COLORLESS (A) 06/13/2017 1315   APPEARANCEUR CLEAR 06/13/2017 1315   LABSPEC 1.008 06/13/2017 1315   PHURINE 8.0 06/13/2017 1315   GLUCOSEU 50 (A) 06/13/2017 1315   HGBUR SMALL (A) 06/13/2017 1315   BILIRUBINUR NEGATIVE 06/13/2017 1315   KETONESUR NEGATIVE 06/13/2017 1315   PROTEINUR 100 (A) 06/13/2017 1315   UROBILINOGEN 0.2 12/02/2013 1631   NITRITE NEGATIVE 06/13/2017 1315   LEUKOCYTESUR NEGATIVE 06/13/2017 1315   Sepsis Labs: @LABRCNTIP (procalcitonin:4,lacticidven:4)  ) Recent Results (from the past 240 hour(s))  MRSA PCR Screening     Status: None    Collection Time: 10/19/18  8:10 AM  Result Value Ref Range Status   MRSA by PCR NEGATIVE NEGATIVE Final    Comment:        The GeneXpert MRSA Assay (FDA approved for NASAL specimens only), is one component of a comprehensive MRSA colonization surveillance program. It is not intended to diagnose MRSA infection nor to guide or monitor treatment for MRSA infections. Performed at [facility], [place] [address] [place], [place] [address]       Studies: No results found.  Scheduled Meds: . amLODipine  5 mg Oral Daily  . apixaban  5 mg Oral BID  . furosemide  40 mg Oral Daily  . metoprolol tartrate  25 mg Oral BID  . sodium chloride flush  3 mL Intravenous Q12H    Continuous Infusions: . sodium chloride       LOS: 2 days     Desiree Hane, MD [facility] Pager [phone]  If 7PM-7AM, please contact night-coverage [email or web address]  Password [email or web address] 10/20/2018, 8:24 AM

## 2018-10-21 ENCOUNTER — Inpatient Hospital Stay (HOSPITAL_COMMUNITY)

## 2018-10-21 ENCOUNTER — Encounter (HOSPITAL_COMMUNITY): Admission: EM | Payer: Self-pay | Source: Home / Self Care | Attending: Internal Medicine

## 2018-10-21 ENCOUNTER — Other Ambulatory Visit: Payer: Self-pay

## 2018-10-21 ENCOUNTER — Encounter (HOSPITAL_COMMUNITY): Payer: Self-pay | Admitting: Cardiovascular Disease

## 2018-10-21 ENCOUNTER — Inpatient Hospital Stay (HOSPITAL_COMMUNITY): Admitting: Anesthesiology

## 2018-10-21 DIAGNOSIS — I4891 Unspecified atrial fibrillation: Secondary | ICD-10-CM

## 2018-10-21 DIAGNOSIS — I480 Paroxysmal atrial fibrillation: Secondary | ICD-10-CM

## 2018-10-21 HISTORY — PX: CARDIOVERSION: SHX1299

## 2018-10-21 HISTORY — PX: TEE WITHOUT CARDIOVERSION: SHX5443

## 2018-10-21 LAB — BASIC METABOLIC PANEL
Anion gap: 7 (ref 5–15)
BUN: 9 mg/dL (ref 6–20)
CO2: 24 mmol/L (ref 22–32)
Calcium: 8.8 mg/dL — ABNORMAL LOW (ref 8.9–10.3)
Chloride: 106 mmol/L (ref 98–111)
Creatinine, Ser: 0.92 mg/dL (ref 0.61–1.24)
GFR calc non Af Amer: >60 mL/min (ref >60)
Glucose, Bld: 95 mg/dL (ref 70–99)
Potassium: 3.7 mmol/L (ref 3.5–5.1)
Sodium: 137 mmol/L (ref 135–145)

## 2018-10-21 LAB — CBC
HCT: 49.4 % (ref 39.0–52.0)
Hemoglobin: 16.3 g/dL (ref 13.0–17.0)
MCH: 30.1 pg (ref 26.0–34.0)
MCHC: 33 g/dL (ref 30.0–36.0)
MCV: 91.1 fL (ref 80.0–100.0)
NRBC: 0 % (ref 0.0–0.2)
Platelets: 307 10*3/uL (ref 150–400)
RBC: 5.42 MIL/uL (ref 4.22–5.81)
RDW: 14.2 % (ref 11.5–15.5)
WBC: 7.3 10*3/uL (ref 4.0–10.5)

## 2018-10-21 SURGERY — ECHOCARDIOGRAM, TRANSESOPHAGEAL
Anesthesia: Monitor Anesthesia Care

## 2018-10-21 MED ORDER — FUROSEMIDE 40 MG PO TABS: 40.0000 mg | ORAL_TABLET | ORAL | 0 refills | Status: DC | PRN

## 2018-10-21 MED ORDER — PROPOFOL 500 MG/50ML IV EMUL
INTRAVENOUS | Status: DC | PRN
  Administered 2018-10-21: 100 ug/kg/min via INTRAVENOUS

## 2018-10-21 MED ORDER — LACTATED RINGERS IV SOLN: INTRAVENOUS | Status: DC | PRN

## 2018-10-21 MED ORDER — APIXABAN 5 MG PO TABS: 5.0000 mg | ORAL_TABLET | Freq: Two times a day (BID) | ORAL | 0 refills | Status: AC

## 2018-10-21 MED ORDER — METOPROLOL TARTRATE 25 MG PO TABS: 25.0000 mg | ORAL_TABLET | Freq: Two times a day (BID) | ORAL | 0 refills | Status: DC

## 2018-10-21 MED ORDER — PROPOFOL 10 MG/ML IV BOLUS
INTRAVENOUS | Status: DC | PRN
  Administered 2018-10-21: 40 mg via INTRAVENOUS
  Administered 2018-10-21: 20 mg via INTRAVENOUS
  Administered 2018-10-21: 40 mg via INTRAVENOUS
  Administered 2018-10-21: 20 mg via INTRAVENOUS

## 2018-10-21 MED ORDER — LIDOCAINE 2% (20 MG/ML) 5 ML SYRINGE
INTRAMUSCULAR | Status: DC | PRN
  Administered 2018-10-21: 100 mg via INTRAVENOUS

## 2018-10-21 NOTE — Progress Notes (Signed)
   53 year old with new onset atrial fibrillation chest pain negative troponin with hypertension  Atrial fibrillation - Cardioversion successful -Continue Eliquis for 4 weeks post cardioversion. -Chads vasc score is 1  Chest pain has resolved, nonischemic, possibly mediated by atrial fibrillation.  [facility] will sign off.   Medication Recommendations: Maintain Eliquis for 4 weeks post cardioversion, CHADSVASc - 1.  Does not require long-term anticoagulation. Other recommendations (labs, testing, etc):  none Follow up as an outpatient: Okay to follow-up with primary physician.  Candee Furbish, MD

## 2018-10-21 NOTE — Interval H&P Note (Signed)
History and Physical Interval Note:  10/21/2018 7:28 AM  Seth Mcdaniel  has presented today for surgery, with the diagnosis of AFIB  The various methods of treatment have been discussed with the patient and family. After consideration of risks, benefits and other options for treatment, the patient has consented to  Procedure(s): TRANSESOPHAGEAL ECHOCARDIOGRAM (TEE) (N/A) CARDIOVERSION (N/A) as a surgical intervention .  The patient's history has been reviewed, patient examined, no change in status, stable for surgery.  I have reviewed the patient's chart and labs.  Questions were answered to the patient's satisfaction.     [name], MD

## 2018-10-21 NOTE — Progress Notes (Signed)
D/C instructions reviewed with pt. Medications reviewed. All questions answered. IV removed, clean and intact. Telemetry removed. Pt to be escort by [employer].  Clyde Canterbury, RN

## 2018-10-21 NOTE — Anesthesia Postprocedure Evaluation (Signed)
Anesthesia Post Note  Patient: Seth Mcdaniel  Procedure(s) Performed: TRANSESOPHAGEAL ECHOCARDIOGRAM (TEE) (N/A ) CARDIOVERSION (N/A )     Patient location during evaluation: PACU Anesthesia Type: MAC Level of consciousness: awake and alert Pain management: pain level controlled Vital Signs Assessment: post-procedure vital signs reviewed and stable Respiratory status: spontaneous breathing and respiratory function stable Cardiovascular status: stable Postop Assessment: no apparent nausea or vomiting Anesthetic complications: no    Last Vitals:  Vitals:   10/21/18 0920 10/21/18 0930  BP: 129/83 124/82  Pulse: 64 71  Resp: (!) 22 17  Temp:    SpO2: 99% 100%    Last Pain:  Vitals:   10/21/18 0953  TempSrc:   PainSc: 8                  Olon Russ DANIEL

## 2018-10-21 NOTE — Transfer of Care (Signed)
Immediate Anesthesia Transfer of Care Note  Patient: Seth Mcdaniel  Procedure(s) Performed: TRANSESOPHAGEAL ECHOCARDIOGRAM (TEE) (N/A ) CARDIOVERSION (N/A )  Patient Location: Endoscopy Unit  Anesthesia Type:MAC  Level of Consciousness: drowsy  Airway & Oxygen Therapy: Patient Spontanous Breathing and Patient connected to nasal cannula oxygen  Post-op Assessment: Report given to RN and Post -op Vital signs reviewed and stable  Post vital signs: Reviewed and stable  Last Vitals:  Vitals Value Taken Time  BP    Temp    Pulse 71 10/21/2018  9:16 AM  Resp    SpO2 99 % 10/21/2018  9:16 AM  Vitals shown include unvalidated device data.  Last Pain:  Vitals:   10/21/18 0710  TempSrc: Oral  PainSc: 8          Complications: No apparent anesthesia complications

## 2018-10-21 NOTE — Progress Notes (Signed)
Pt reports this morning to RN that he has been having frank blood in stool since taking Eliquis. Unable to visualize stool as pt flushed, when asked pt states it is a moderate amount of blood. Not symptomatic of any anemia and last hemoglobin was 16.4 (1/11). Will continue to monitor pt.

## 2018-10-21 NOTE — Discharge Summary (Signed)
Discharge Summary  Seth Mcdaniel OZD:[id number] DOB: 1966-01-10  PCP: Patient, No Pcp Per  Admit date: 10/17/2018 Discharge date: 10/21/2018   Time spent: < 25 minutes  Admitted From: Home(Prison) Disposition:  Home(Prison)  Recommendations for Outpatient Follow-up:  1. Monitoring over the next 24 hours to ensure no recurrent lower leg swelling, if so gave oral Lasix (prescribed). 2. New medications: Continue Eliquis for 4 weeks post cardioversion 3. Will need BP check in 1 week.  May need to increase amlodipine to 10 mg if continues to remain elevated      Discharge Diagnoses:  Active Hospital Problems   Diagnosis Date Noted  . Chest pain in adult 10/18/2018  . Bilateral lower extremity edema 10/19/2018  . HTN (hypertension) 10/18/2018  . Atrial fibrillation ([place]) 10/18/2018  . Chest pain 10/18/2018    Resolved Hospital Problems  No resolved problems to display.    Discharge Condition: Stable  CODE STATUS: Full code   History of present illness:  Seth Mcdaniel is a 53 y.o. year old male with medical history significant for hypertension who presented from prison on 10/17/2018 with 2 weeks of nonradiating, left-sided chest pain worse with exertion and new lower extremity swelling and reported 40 pound weight gain and was found to have new onset atrial fibrillation. Remaining hospital course addressed in problem based format below:   Hospital Course:   Atrial fibrillation, new onset,  Was started on Eliquis for anticoagulation per cardiology recommendations, underwent successful TEE guided cardioversion on 1/13 is now in normal sinus rhythm.  Must continue Eliquis for total 4 weeks.  Continue Lopressor twice daily for rate control.  Chest pain, resolved Likely chest discomfort in the setting of atrial fibrillation, no longer experiencing.  Initial troponin was negative and nonischemic EKG  Lower extremity edema, resolved Preserved EF on TTE,  BMP unremarkable, chest x-ray with no vascular congestion or pulmonary edema, unable to assess diastolic dysfunction, very possible swelling occurred related to atrial fibrillation.   Will not need to continue Lasix on discharge, can resume prior to admission HCTZ.  Will advise monitoring over an additional 24 hours on return to ensure no recurrent peripheral edema if so can use as needed Lasix.  Hypertension, slightly elevated in the 259D systolic wise.  Will resume prior to admission Lopressor at reduced dose of 25 mg 2 times daily and HCTZ as well as amlodipine 5 mg.  Will need BP check in 1 week.  May need to increase amlodipine to 10 mg if continues to remain elevated     Consultations:  Cardiology  Procedures/Studies: TEE guided electrical cardioversion converted to normal sinus rhythm without complications.  TTE, 1/10 Study Conclusions  - Left ventricle: The cavity size was normal. There was mild   concentric hypertrophy. Systolic function was normal. The   estimated ejection fraction was in the range of 60% to 65%. Wall   motion was normal; there were no regional wall motion   abnormalities.  Discharge Exam: BP (!) 142/85 (BP Location: Right Arm)   Pulse 80   Temp 98.6 F (37 C) (Oral)   Resp 20   Ht 6\' 3"  (1.905 m)   Wt 122.7 kg   SpO2 99%   BMI 33.82 kg/m   General: Sitting in bedside chair, no apparent distress Eyes: EOMI, anicteric ENT: Oral Mucosa clear and moist Cardiovascular: regular rate and rhythm, no murmurs, rubs or gallops, no edema, Respiratory: Normal respiratory effort, lungs clear to auscultation bilaterally Abdomen: soft, non-distended, non-tender, normal  bowel sounds Skin: No Rash Neurologic: Grossly no focal neuro deficit.Mental status AAOx3, speech normal, Psychiatric:Appropriate affect, and mood   Discharge Instructions You were cared for by a hospitalist during your hospital stay. If you have any questions about your discharge  medications or the care you received while you were in the hospital after you are discharged, you can call the unit and asked to speak with the hospitalist on call if the hospitalist that took care of you is not available. Once you are discharged, your primary care physician will handle any further medical issues. Please note that NO REFILLS for any discharge medications will be authorized once you are discharged, as it is imperative that you return to your primary care physician (or establish a relationship with a primary care physician if you do not have one) for your aftercare needs so that they can reassess your need for medications and monitor your lab values.  Discharge Instructions    Diet - low sodium heart healthy   Complete by:  As directed    Increase activity slowly   Complete by:  As directed      Allergies as of 10/21/2018      Reactions             Medication List    TAKE these medications   acetaminophen 325 MG tablet Commonly known as:  TYLENOL Take 650 mg by mouth 2 (two) times daily.   amLODipine 5 MG tablet Commonly known as:  NORVASC Take 1 tablet (5 mg total) by mouth daily. What changed:  how much to take   apixaban 5 MG Tabs tablet Commonly known as:  ELIQUIS Take 1 tablet (5 mg total) by mouth 2 (two) times daily for 28 days.   aspirin EC 81 MG tablet Take 81 mg by mouth daily.   bismuth subsalicylate 161 WR/60AV suspension Commonly known as:  PEPTO BISMOL Take 30 mLs by mouth every 6 (six) hours as needed for diarrhea or loose stools (stomach pain).   furosemide 40 MG tablet Commonly known as:  LASIX Take 1 tablet (40 mg total) by mouth as needed (swelling in legs).   hydrochlorothiazide 25 MG tablet Commonly known as:  HYDRODIURIL Take 1 tablet (25 mg total) by mouth daily.   ibuprofen 200 MG tablet Commonly known as:  ADVIL,MOTRIN Take 600 mg by mouth 2 (two) times daily.   metoprolol tartrate 25 MG tablet Commonly known as:   LOPRESSOR Take 1 tablet (25 mg total) by mouth 2 (two) times daily. What changed:  Another medication with the same name was removed. Continue taking this medication, and follow the directions you see here.   ranitidine 150 MG tablet Commonly known as:  ZANTAC Take 1 tablet (150 mg total) by mouth 2 (two) times daily.       The results of significant diagnostics from this hospitalization (including imaging, microbiology, ancillary and laboratory) are listed below for reference.    Significant Diagnostic Studies: Dg Chest 2 View  Result Date: 10/17/2018 CLINICAL DATA:  Chest pain EXAM: CHEST - 2 VIEW COMPARISON:  None. FINDINGS: The lungs are clear without focal pneumonia, edema, pneumothorax or pleural effusion. Nodular density identified in the right infrahilar region on the frontal projection. The cardiopericardial silhouette is within normal limits for size. The visualized bony structures of the thorax are intact. IMPRESSION: Right infrahilar lung nodule, potentially related to vasculature. CT chest without contrast recommended to further evaluate. Electronically Signed   By: Verda Cumins.D.  On: 10/17/2018 18:34   Ct Angio Chest Pe W And/or Wo Contrast  Result Date: 10/17/2018 CLINICAL DATA:  Chest pain with intermittent dyspnea x4 days. Infrahilar nodular density seen on same day chest radiograph. EXAM: CT ANGIOGRAPHY CHEST WITH CONTRAST TECHNIQUE: Multidetector CT imaging of the chest was performed using the standard protocol during bolus administration of intravenous contrast. Multiplanar CT image reconstructions and MIPs were obtained to evaluate the vascular anatomy. CONTRAST:  155mL ISOVUE-370 IOPAMIDOL (ISOVUE-370) INJECTION 76% COMPARISON:  Same day CXR FINDINGS: Cardiovascular: Satisfactory opacification of the pulmonary arteries to the segmental level. No evidence of pulmonary embolism. Normal heart size. No pericardial effusion. Nonaneurysmal minimally atherosclerotic thoracic  aorta. Mediastinum/Nodes: Borderline enlarged hilar lymph nodes. No mediastinal adenopathy. No enlargement of the axillary lymph nodes. Thyroid gland, trachea, and esophagus demonstrate no significant findings. Lungs/Pleura: Tiny perifissural lymph nodes are suggested bilaterally along the major fissures accounting for subtle nodular densities, the largest on the right measuring 4 mm and on left 2 mm. A 4.5 mm noncalcified right middle lobe nodule, series 7/60 is identified with a 4 mm lingular nodule, series 7/72. Bibasilar dependent atelectasis is seen. There is no pneumothorax or effusion. Upper Abdomen: No acute abnormality. A partially included left interpolar renal cyst measuring up to 2.7 cm is identified. No nephrolithiasis nor obstructive uropathy. Musculoskeletal: No acute nor suspicious osseous abnormality. Subcutaneous nodule is noted along the dorsum of the thorax measuring up to 2.3 cm, nonspecific but possibly large sebaceous cyst. Review of the MIP images confirms the above findings. IMPRESSION: 1. No acute pulmonary embolus, aortic aneurysm or dissection. 2. No pulmonary nodule in the infrahilar portion of the chest to account for the nodular density seen on the chest radiograph. Findings most likely represented a pulmonary vessel seen on end. 3. 4.5 mm noncalcified right middle lobe nodule and 4 mm lingular nodule. Probable tiny perifissural lymph nodes are noted bilaterally along the major fissures as well. No follow-up needed if patient is low-risk (and has no known or suspected primary neoplasm). Non-contrast chest CT can be considered in 12 months if patient is high-risk. This recommendation follows the consensus statement: Guidelines for Management of Incidental Pulmonary Nodules Detected on CT Images: From the Fleischner Society 2017; Radiology 2017; 284:228-243. 4. Subcutaneous nodule measuring up 2.3 cm along the posterior aspect of the thorax. Findings might represent a sebaceous cyst but  is nonspecific on CT. Aortic Atherosclerosis (ICD10-I70.0). Electronically Signed   By: Ashley Royalty M.D.   On: 10/17/2018 23:14    Microbiology: Recent Results (from the past 240 hour(s))  MRSA PCR Screening     Status: None   Collection Time: 10/19/18  8:10 AM  Result Value Ref Range Status   MRSA by PCR NEGATIVE NEGATIVE Final    Comment:        The GeneXpert MRSA Assay (FDA approved for NASAL specimens only), is one component of a comprehensive MRSA colonization surveillance program. It is not intended to diagnose MRSA infection nor to guide or monitor treatment for MRSA infections. Performed at [facility], [place] [address] [place], [place] [address]      Labs: Basic Metabolic Panel: Recent Labs  Lab 10/17/18 1757 10/17/18 2246 10/18/18 0951 10/19/18 0809 10/20/18 0251 10/21/18 0224  NA 135  --  135 136 137 137  K 3.7  --  3.9 3.7 3.6 3.7  CL 100  --  103 103 105 106  CO2 24  --  23 24 25 24   GLUCOSE 102*  --  82 155* 103* 95  BUN 7  --  11 11 11 9   CREATININE 0.92  --  1.14 0.95 1.16 0.92  CALCIUM 9.9  --  9.4 9.0 9.1 8.8*  MG  --  2.3  --   --   --   --    Liver Function Tests: Recent Labs  Lab 10/18/18 0951  AST 65*  ALT 82*  ALKPHOS 58  BILITOT 0.8  PROT 8.1  ALBUMIN 3.6   No results for input(s): LIPASE, AMYLASE in the last 168 hours. No results for input(s): AMMONIA in the last 168 hours. CBC: Recent Labs  Lab 10/17/18 1757 10/18/18 0951 10/19/18 0809 10/21/18 0640  WBC 10.1 8.3 7.9 7.3  HGB 17.0 16.8 16.4 16.3  HCT 49.9 48.9 48.7 49.4  MCV 89.6 89.2 91.2 91.1  PLT 331 339 309 307   Cardiac Enzymes: Recent Labs  Lab 10/18/18 0320 10/18/18 0951 10/18/18 1636  TROPONINI <0.03 <0.03 <0.03   BNP: BNP (last 3 results) Recent Labs    10/18/18 0320  BNP 62.1    ProBNP (last 3 results) No results for input(s): PROBNP in the last 8760 hours.  CBG: No results for input(s): GLUCAP in the last 168  hours.     Signed:  Desiree Hane, MD [facility] 10/21/2018, 3:38 PM

## 2018-10-21 NOTE — CV Procedure (Signed)
Brief TEE Note  LVEF 55-60% No LA/LAA thrombus or mass Trivial TR, trivial PR Mild MR  Electrical Cardioversion Procedure Note Seth Mcdaniel [id number] 18-Apr-1966  Procedure: Electrical Cardioversion Indications:  Atrial Fibrillation  Procedure Details Consent: Risks of procedure as well as the alternatives and risks of each were explained to the (patient/caregiver).  Consent for procedure obtained. Time Out: Verified patient identification, verified procedure, site/side was marked, verified correct patient position, special equipment/implants available, medications/allergies/relevent history reviewed, required imaging and test results available.  Performed  Patient placed on cardiac monitor, pulse oximetry, supplemental oxygen as necessary.  Sedation given: propofol Pacer pads placed anterior and posterior chest.  Cardioverted 1 time(s).  Cardioverted at [place].  Evaluation Findings: Post procedure EKG shows: NSR Complications: None Patient did tolerate procedure well.   Seth Latch, MD 10/21/2018, 9:10 AM

## 2018-10-21 NOTE — Progress Notes (Signed)
Pt received from endo. VSS. Heart rhythm NSR. Breakfast tray ordered. Will continue to monitor.  Clyde Canterbury, RN

## 2018-10-22 ENCOUNTER — Encounter (HOSPITAL_COMMUNITY): Payer: Self-pay | Admitting: Cardiovascular Disease

## 2019-06-01 ENCOUNTER — Emergency Department (HOSPITAL_COMMUNITY)
Admission: EM | Admit: 2019-06-01 | Discharge: 2019-06-01 | Disposition: A | Discharge status: Home / Self Care | Payer: Self-pay | Attending: Emergency Medicine | Admitting: Emergency Medicine

## 2019-06-01 ENCOUNTER — Other Ambulatory Visit: Payer: Self-pay

## 2019-06-01 ENCOUNTER — Encounter (HOSPITAL_COMMUNITY): Payer: Self-pay | Admitting: Emergency Medicine

## 2019-06-01 DIAGNOSIS — Z79899 Other long term (current) drug therapy: Secondary | ICD-10-CM | POA: Insufficient documentation

## 2019-06-01 DIAGNOSIS — Z7982 Long term (current) use of aspirin: Secondary | ICD-10-CM | POA: Insufficient documentation

## 2019-06-01 DIAGNOSIS — F17210 Nicotine dependence, cigarettes, uncomplicated: Secondary | ICD-10-CM | POA: Insufficient documentation

## 2019-06-01 DIAGNOSIS — I10 Essential (primary) hypertension: Secondary | ICD-10-CM | POA: Insufficient documentation

## 2019-06-01 MED ORDER — AMLODIPINE BESYLATE 5 MG PO TABS: 5.0000 mg | ORAL_TABLET | Freq: Every day | ORAL | 0 refills | Status: DC

## 2019-06-01 MED ORDER — AMLODIPINE BESYLATE 5 MG PO TABS
5.0000 mg | ORAL_TABLET | Freq: Once | ORAL | Status: AC
  Administered 2019-06-01: 5 mg via ORAL
  Filled 2019-06-01: qty 1

## 2019-06-01 MED ORDER — HYDROCHLOROTHIAZIDE 25 MG PO TABS: 25.0000 mg | ORAL_TABLET | Freq: Every day | ORAL | 1 refills | Status: DC

## 2019-06-01 MED ORDER — HYDROCHLOROTHIAZIDE 25 MG PO TABS
25.0000 mg | ORAL_TABLET | Freq: Every day | ORAL | Status: DC
  Administered 2019-06-01: 25 mg via ORAL
  Filled 2019-06-01: qty 1

## 2019-06-01 NOTE — Discharge Instructions (Addendum)
Today you were seen for elevated blood pressure. We gave you a dose of amlodipine and hydrochlorothiazide today. We also sent a prescription for these two medications to [facility]. We want you to start taking these medications tomorrow. We also have provided you a number for the [facility] where you can establish care with a primary care provider.  Please return to the emergency room if you experience chest pain, shortness of breath, severe headache, changes in vision, or any other concerning symptom.  Thank you for allowing [place] to be part of your medical care

## 2019-06-01 NOTE — ED Triage Notes (Signed)
Pt arrives to ED from home with complaints of dizziness upon waking this morning. When EMS arrived patients BP was 210/120. Patient has not taken his BP medication in tow months due to being arrested and now on house arrest.

## 2019-06-01 NOTE — ED Provider Notes (Signed)
[facility] EMERGENCY DEPARTMENT Provider Note   CSN: [id number] Arrival date & time: 06/01/19  0815     History   Chief Complaint Chief Complaint  Patient presents with  . Hypertension    HPI Seth Mcdaniel is a 53 y.o. male past medical history of hypertension who presents after measuring his blood pressure twice this morning with highest of 210/170.  Patient says he has not measured his blood pressure in a while but measured this morning as he felt lightheaded when he first stood up out of bed.  Patient states he used to be on amlodipine, hydrochlorothiazide, metoprolol but has not taken these medications in 2-1/2 months after being discharged from jail.  Patient denies chest pain, shortness of breath, headache, changes in vision, nausea, vomiting, abdominal pain.  Patient does not see a primary care provider but is interested in resources on affording medical care and medications without insurance.  Patient reports chronic cough for the past 1-1/2 months with some congestion.  Patient reports smoking 1.5 packs/day of cigarettes.     HPI  Past Medical History:  Diagnosis Date  . Hypertension     Patient Active Problem List   Diagnosis Date Noted  . Bilateral lower extremity edema 10/19/2018  . HTN (hypertension) 10/18/2018  . Atrial fibrillation ([place]) 10/18/2018  . Chest pain in adult 10/18/2018  . Chest pain 10/18/2018  . Dental caries extending into pulp 12/02/2013  . Tooth infection 12/02/2013  . Hypertensive urgency 12/02/2013  . 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 CARDIOVERSION N/A 10/21/2018   Procedure: CARDIOVERSION;  Surgeon: [name], MD;  Location: [facility];  Service: Cardiovascular;  Laterality: N/A;  . TEE WITHOUT CARDIOVERSION N/A 10/21/2018   Procedure: TRANSESOPHAGEAL ECHOCARDIOGRAM (TEE);  Surgeon: [name], MD;  Location: [facility];  Service: Cardiovascular;   Laterality: N/A;        Home Medications    Prior to Admission medications   Medication Sig Start Date End Date Taking? Authorizing Provider  acetaminophen (TYLENOL) 325 MG tablet Take 650 mg by mouth 2 (two) times daily.     [provider]  amLODipine (NORVASC) 5 MG tablet Take 1 tablet (5 mg total) by mouth daily. 06/01/19   Jeanmarie Hubert, MD  apixaban (ELIQUIS) 5 MG TABS tablet Take 1 tablet (5 mg total) by mouth 2 (two) times daily for 28 days. 10/21/18 11/18/18  Oretha Milch D, MD  aspirin EC 81 MG tablet Take 81 mg by mouth daily.    [provider]  bismuth subsalicylate (PEPTO BISMOL) 262 MG/15ML suspension Take 30 mLs by mouth every 6 (six) hours as needed for diarrhea or loose stools (stomach pain).    [provider]  furosemide (LASIX) 40 MG tablet Take 1 tablet (40 mg total) by mouth as needed (swelling in legs). 10/21/18   Desiree Hane, MD  hydrochlorothiazide (HYDRODIURIL) 25 MG tablet Take 1 tablet (25 mg total) by mouth daily. 06/01/19   Jeanmarie Hubert, MD  ibuprofen (ADVIL,MOTRIN) 200 MG tablet Take 600 mg by mouth 2 (two) times daily.     [provider]  metoprolol tartrate (LOPRESSOR) 25 MG tablet Take 1 tablet (25 mg total) by mouth 2 (two) times daily. 10/21/18   Desiree Hane, MD  ranitidine (ZANTAC) 150 MG tablet Take 1 tablet (150 mg total) by mouth 2 (two) times daily. Patient not taking: Reported on 10/18/2018 06/13/17   Street, [place], [place]  Family History History reviewed. No pertinent family history.  Social History Social History   Tobacco Use  . Smoking status: Current Every Day Smoker    Packs/day: 1.00    Types: Cigarettes  . Smokeless tobacco: Former Network engineer Use Topics  . Alcohol use: No  . Drug use: No     Allergies   Penicillins   Review of Systems Review of Systems  Constitutional: Negative for chills and fatigue.  HENT: Negative for congestion.   Respiratory: Negative for  cough and shortness of breath.   Cardiovascular: Negative for chest pain.  Gastrointestinal: Negative for abdominal pain, nausea and vomiting.  All other systems reviewed and are negative.    Physical Exam Updated Vital Signs BP (!) 194/119 (BP Location: Right Arm)   Pulse 67   Temp 98.1 F (36.7 C) (Oral)   Resp 16   Ht 6\' 3"  (1.905 m)   Wt 113.9 kg   SpO2 100%   BMI 31.37 kg/m   Physical Exam Constitutional:      Appearance: Normal appearance.  HENT:     Head: Normocephalic and atraumatic.     Right Ear: External ear normal.     Left Ear: External ear normal.  Neck:     Musculoskeletal: Neck supple.  Cardiovascular:     Rate and Rhythm: Normal rate and regular rhythm.     Heart sounds: Normal heart sounds. No murmur. No friction rub. No gallop.   Pulmonary:     Breath sounds: No wheezing, rhonchi or rales.  Abdominal:     General: Abdomen is flat. There is no distension.     Palpations: Abdomen is soft.     Tenderness: There is no abdominal tenderness. There is no guarding.  Musculoskeletal:        General: No swelling or tenderness.  Skin:    General: Skin is warm and dry.  Neurological:     Mental Status: He is alert.  Psychiatric:        Mood and Affect: Mood normal.        Behavior: Behavior normal.      ED Treatments / Results  Labs (all labs ordered are listed, but only abnormal results are displayed) Labs Reviewed - No data to display  EKG None  Radiology No results found.  Procedures Procedures (including critical care time)  Medications Ordered in ED Medications  hydrochlorothiazide (HYDRODIURIL) tablet 25 mg (25 mg Oral Given 06/01/19 0950)  amLODipine (NORVASC) tablet 5 mg (5 mg Oral Given 06/01/19 [id number])     Initial Impression / Assessment and Plan / ED Course  I have reviewed the triage vital signs and the nursing notes.  Pertinent labs & imaging results that were available during my care of the patient were reviewed by me and  considered in my medical decision making (see chart for details).        [name] is a 53 year old male with past medical history of hypertension who presents with elevated asymptomatic blood pressure to 215/121 after not taking his medications for 2.5 months.  Last admission in January, patient found to have atrial fibrillation and underwent TEE guided cardioversion followed by 4-week course of Eliquis.    Patient given dose of amlodipine 5 mg and hydrochlorothiazide 25 mg with improvement in pressure.  Patient prescribed 30 day supply of these medications which should be low cost at [facility].  Patient provided referral to [facility].  Final Clinical Impressions(s) / ED  Diagnoses   Final diagnoses:  Hypertension, unspecified type    ED Discharge Orders         Ordered    amLODipine (NORVASC) 5 MG tablet  Daily     06/01/19 0936    hydrochlorothiazide (HYDRODIURIL) 25 MG tablet  Daily     06/01/19 0936           Jeanmarie Hubert, MD 06/01/19 [id number]    Lajean Saver, MD 06/01/19 1106

## 2019-06-28 IMAGING — CT CT ANGIO CHEST
2 of 7 series · 18 of 46 positions shown · IV contrast (APPLIED)
Comparison: Same day CXR

CLINICAL DATA: Chest pain with intermittent dyspnea x4 days.
Infrahilar nodular density seen on same day chest radiograph.

EXAM:
CT ANGIOGRAPHY CHEST WITH CONTRAST
TECHNIQUE: Multidetector CT imaging of the chest was performed using the
standard protocol during bolus administration of intravenous
contrast. Multiplanar CT image reconstructions and MIPs were
obtained to evaluate the vascular anatomy.
CONTRAST:  100mL 20OJBB-MTP IOPAMIDOL (20OJBB-MTP) INJECTION 76%

[Series 8: thins · axial · 0.78mm/px · z∈[+1085,+1383]mm · 15 of 480 slices shown]
[im 27/480  lung]
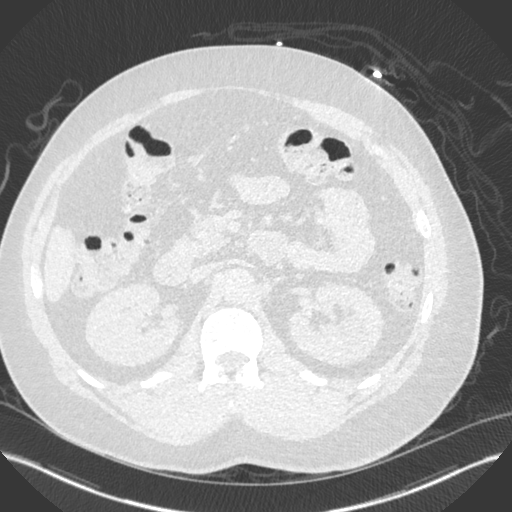
[im 54/480  soft-tissue]
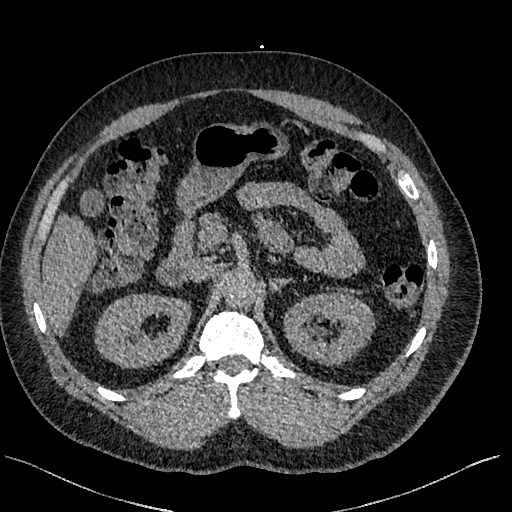
[im 80/480  lung]
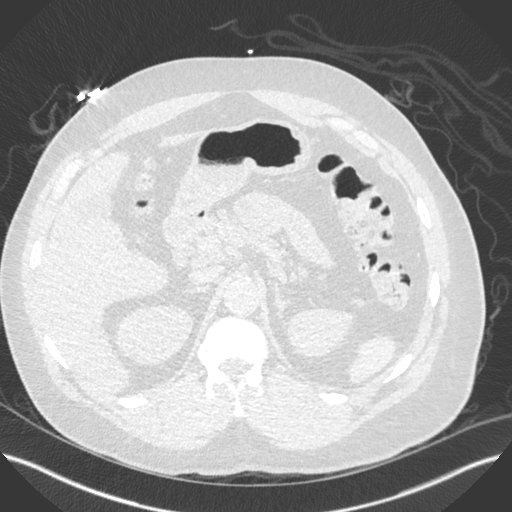
[im 107/480  soft-tissue]
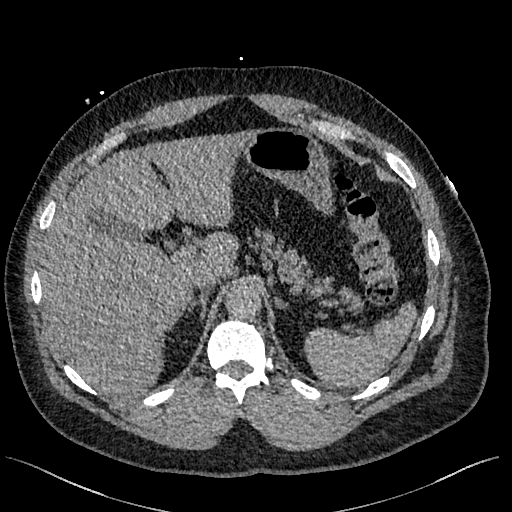
[im 160/480  lung]
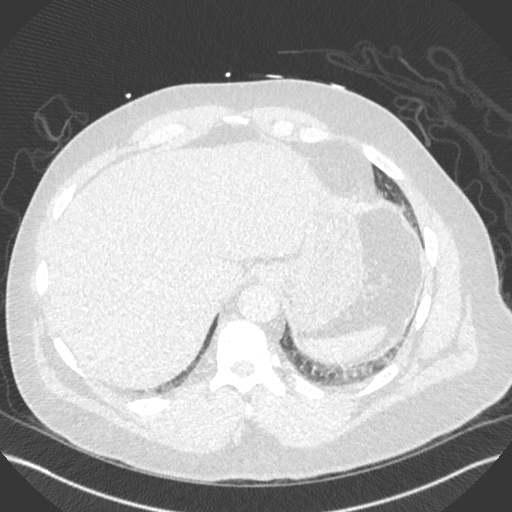
[im 187/480  soft-tissue]
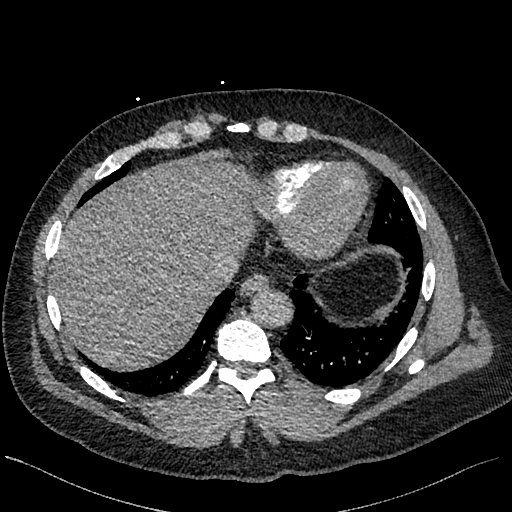
[im 213/480  lung]
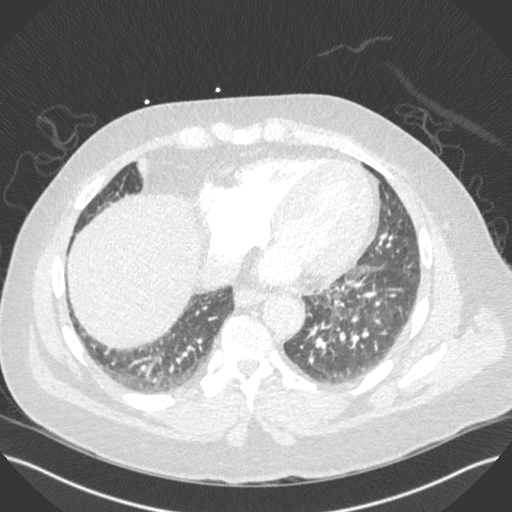
[im 240/480  soft-tissue]
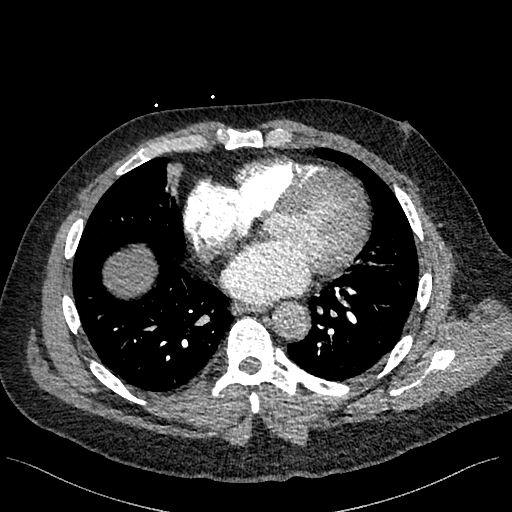
[im 267/480  lung]
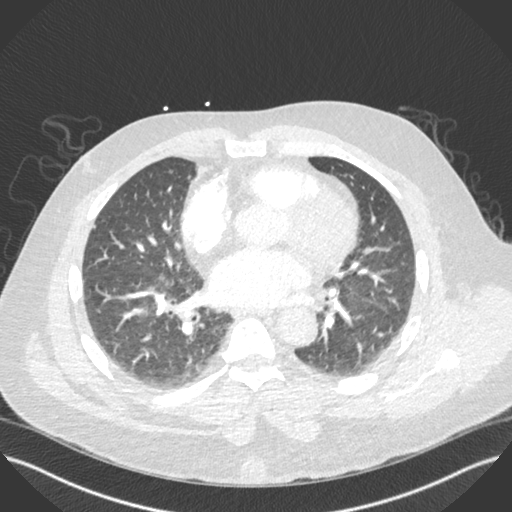
[im 293/480  soft-tissue]
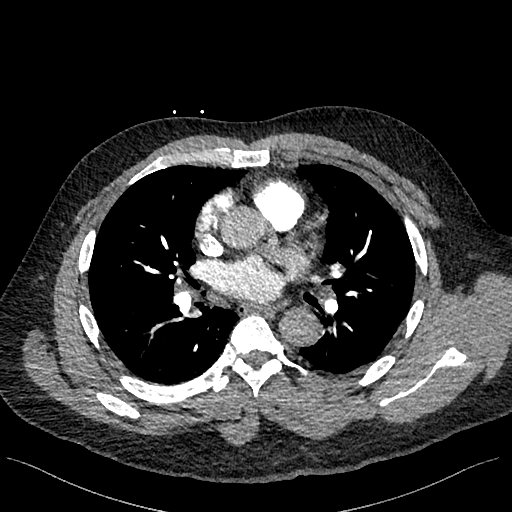
[im 320/480  lung]
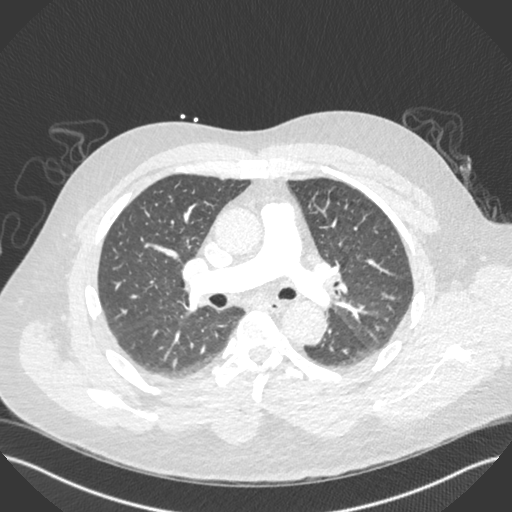
[im 373/480  soft-tissue]
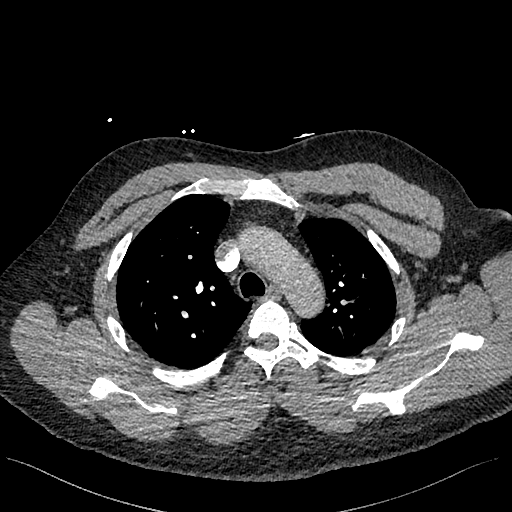
[im 400/480  lung]
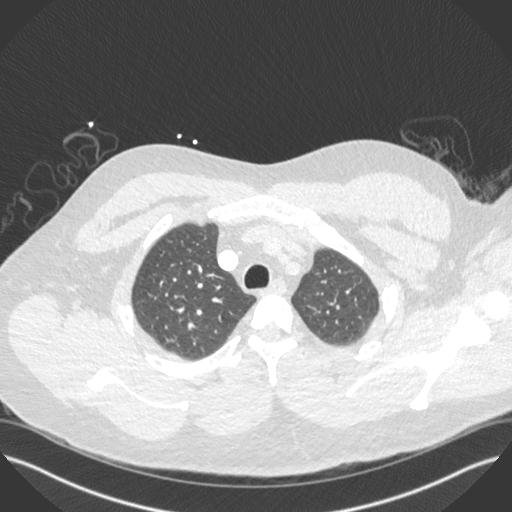
[im 426/480  soft-tissue]
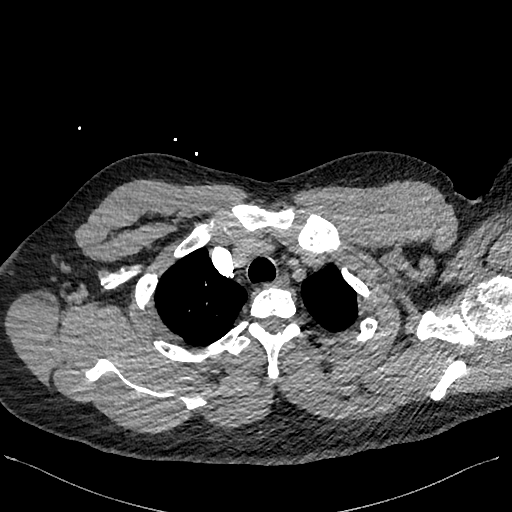
[im 453/480  lung]
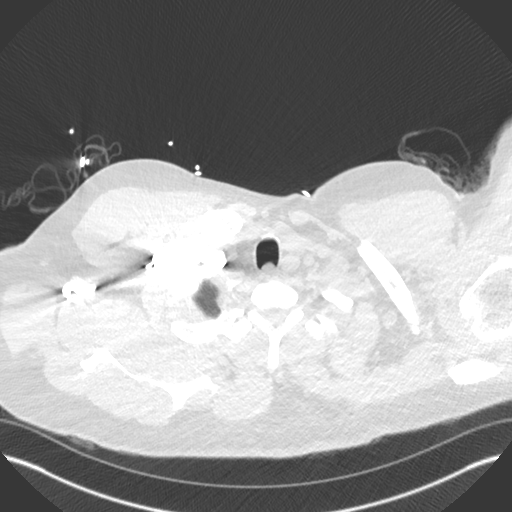

[Series 9: cor · coronal · 0.75mm/px · 3 of 160 slices shown]
[im 40/160  soft-tissue]
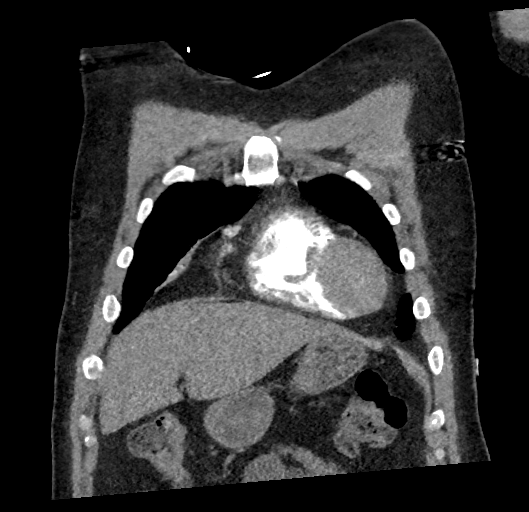
[im 80/160  soft-tissue]
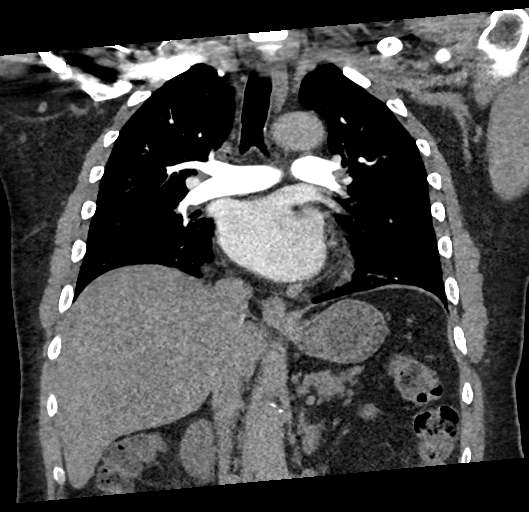
[im 120/160  soft-tissue]
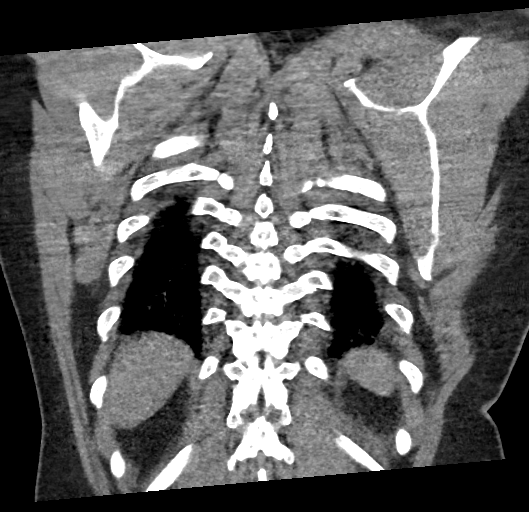

[18 of 46 positions shown; findings below may reference images not displayed]

FINDINGS: Cardiovascular: Satisfactory opacification of the pulmonary arteries
to the segmental level. No evidence of pulmonary embolism. Normal
heart size. No pericardial effusion. Nonaneurysmal minimally
atherosclerotic thoracic aorta.

Mediastinum/Nodes: Borderline enlarged hilar lymph nodes. No
mediastinal adenopathy. No enlargement of the axillary lymph nodes.
Thyroid gland, trachea, and esophagus demonstrate no significant
findings.

Lungs/Pleura: Tiny perifissural lymph nodes are suggested
bilaterally along the major fissures accounting for subtle nodular
densities, the largest on the right measuring 4 mm and on left 2 mm.
A 4.5 mm noncalcified right middle lobe nodule, series [DATE] is
identified with a 4 mm lingular nodule, series [DATE]. Bibasilar
dependent atelectasis is seen. There is no pneumothorax or effusion.

Upper Abdomen: No acute abnormality. A partially included left
interpolar renal cyst measuring up to 2.7 cm is identified. No
nephrolithiasis nor obstructive uropathy.

Musculoskeletal: No acute nor suspicious osseous abnormality.
Subcutaneous nodule is noted along the dorsum of the thorax
measuring up to 2.3 cm, nonspecific but possibly large sebaceous
cyst.

Review of the MIP images confirms the above findings.
IMPRESSION: 1. No acute pulmonary embolus, aortic aneurysm or dissection.
2. No pulmonary nodule in the infrahilar portion of the chest to
account for the nodular density seen on the chest radiograph.
Findings most likely represented a pulmonary vessel seen on end.
3. 4.5 mm noncalcified right middle lobe nodule and 4 mm lingular
nodule. Probable tiny perifissural lymph nodes are noted bilaterally
along the major fissures as well. No follow-up needed if patient is
low-risk (and has no known or suspected primary neoplasm).
Non-contrast chest CT can be considered in 12 months if patient is
high-risk. This recommendation follows the consensus statement:
Guidelines for Management of Incidental Pulmonary Nodules Detected
[DATE].
4. Subcutaneous nodule measuring up 2.3 cm along the posterior
aspect of the thorax. Findings might represent a sebaceous cyst but
is nonspecific on CT.

Aortic Atherosclerosis (ITGNH-2HP.P).

## 2019-07-09 ENCOUNTER — Other Ambulatory Visit: Payer: Self-pay

## 2019-07-09 ENCOUNTER — Ambulatory Visit: Payer: Self-pay | Attending: Family Medicine | Admitting: Family Medicine

## 2019-07-09 DIAGNOSIS — X500XXA Overexertion from strenuous movement or load, initial encounter: Secondary | ICD-10-CM

## 2019-07-09 DIAGNOSIS — I480 Paroxysmal atrial fibrillation: Secondary | ICD-10-CM

## 2019-07-09 DIAGNOSIS — R1031 Right lower quadrant pain: Secondary | ICD-10-CM

## 2019-07-09 DIAGNOSIS — I10 Essential (primary) hypertension: Secondary | ICD-10-CM

## 2019-07-09 MED ORDER — NAPROXEN 500 MG PO TABS: 500.0000 mg | ORAL_TABLET | Freq: Two times a day (BID) | ORAL | 1 refills | Status: DC

## 2019-07-09 NOTE — Progress Notes (Signed)
Virtual Visit via Telephone Note  I connected with Seth Mcdaniel, on 07/09/2019 at 1:44 PM by telephone due to the COVID-19 pandemic and verified that I am speaking with the correct person using two identifiers.   Consent: I discussed the limitations, risks, security and privacy concerns of performing an evaluation and management service by telephone and the availability of in person appointments. I also discussed with the patient that there may be a patient responsible charge related to this service. The patient expressed understanding and agreed to proceed.   Location of Patient: Home  Location of Provider: Clinic   Persons participating in Telemedicine visit: Kendal Qian Ahrens Farrington-CMA Dr. Felecia Shelling     History of Present Illness: 53 year old male with a history of Hypertension, Atrial fibrillation s/p TEE guided cardioversion in 10/2018 here to establish care. Completed 4 week course of Eliquis post Cardioversion. He has not established with a PCP as he has been in [place]; never followed up with Cardiology either   One week ago he attempted moving a refridgerator alone and subsequently felt a pull in his groin then a lump which he states later "dropped to his groin". States he is able to push it back to his stomach.This is associated with pain but no nausea, vomiting and he is able to move his bowels Pain is a 7/10 in R groin and it increases when he coughs. Using Arthritis pain relief/  His blood pressure at home has been in the 120/86 and he has been compliant with his antihypertensives.  Past Medical History:  Diagnosis Date  . Hypertension    Allergies: PCN  Current Outpatient Medications on File Prior to Visit  Medication Sig Dispense Refill  . amLODipine (NORVASC) 5 MG tablet Take 1 tablet (5 mg total) by mouth daily. 30 tablet 0  . aspirin EC 81 MG tablet Take 81 mg by mouth daily.    Marland Kitchen bismuth subsalicylate (PEPTO BISMOL) 262 MG/15ML  suspension Take 30 mLs by mouth every 6 (six) hours as needed for diarrhea or loose stools (stomach pain).    . furosemide (LASIX) 40 MG tablet Take 1 tablet (40 mg total) by mouth as needed (swelling in legs). 30 tablet 0  . hydrochlorothiazide (HYDRODIURIL) 25 MG tablet Take 1 tablet (25 mg total) by mouth daily. 30 tablet 1  . metoprolol tartrate (LOPRESSOR) 25 MG tablet Take 1 tablet (25 mg total) by mouth 2 (two) times daily. 60 tablet 0  . acetaminophen (TYLENOL) 325 MG tablet Take 650 mg by mouth 2 (two) times daily.     Marland Kitchen apixaban (ELIQUIS) 5 MG TABS tablet Take 1 tablet (5 mg total) by mouth 2 (two) times daily for 28 days. 56 tablet 0  . ibuprofen (ADVIL,MOTRIN) 200 MG tablet Take 600 mg by mouth 2 (two) times daily.     . ranitidine (ZANTAC) 150 MG tablet Take 1 tablet (150 mg total) by mouth 2 (two) times daily. (Patient not taking: Reported on 10/18/2018) 30 tablet 0   No current facility-administered medications on file prior to visit.     Observations/Objective: Alert, awake, oriented x3 Not in acute distress  Assessment and Plan: 1. Paroxysmal atrial fibrillation (HCC) S/p TEE guided cardioversion Completed course of Eliquis Continue Metoprolol - Ambulatory referral to Cardiology  2. Hypertension, unspecified type Controlled  3. Right inguinal pain - naproxen (NAPROSYN) 500 MG tablet; Take 1 tablet (500 mg total) by mouth 2 (two) times daily with a meal.  Dispense: 30 tablet; Refill:  1   Follow Up Instructions: Return in about 2 weeks (around 07/23/2019).    I discussed the assessment and treatment plan with the patient. The patient was provided an opportunity to ask questions and all were answered. The patient agreed with the plan and demonstrated an understanding of the instructions.   The patient was advised to call back or seek an in-person evaluation if the symptoms worsen or if the condition fails to improve as anticipated.     I provided 20 minutes total  of non-face-to-face time during this encounter including median intraservice time, reviewing previous notes, labs, imaging, medications, management and patient verbalized understanding.     Charlott Rakes, MD, FAAFP. [facility] [place], [place]   07/09/2019, 1:44 PM

## 2019-07-10 ENCOUNTER — Encounter: Payer: Self-pay | Admitting: Family Medicine

## 2019-08-04 ENCOUNTER — Ambulatory Visit: Payer: Self-pay | Admitting: Family Medicine

## 2019-08-06 ENCOUNTER — Ambulatory Visit: Payer: Self-pay | Admitting: Cardiology

## 2019-09-02 ENCOUNTER — Ambulatory Visit: Payer: Self-pay | Admitting: Cardiology

## 2022-01-16 ENCOUNTER — Emergency Department (HOSPITAL_COMMUNITY): Payer: Self-pay

## 2022-01-16 ENCOUNTER — Encounter (HOSPITAL_COMMUNITY): Payer: Self-pay | Admitting: Emergency Medicine

## 2022-01-16 ENCOUNTER — Other Ambulatory Visit: Payer: Self-pay

## 2022-01-16 ENCOUNTER — Emergency Department (HOSPITAL_COMMUNITY)
Admission: EM | Admit: 2022-01-16 | Discharge: 2022-01-18 | Disposition: A | Discharge status: Home / Self Care | Payer: Self-pay | Attending: Emergency Medicine | Admitting: Emergency Medicine

## 2022-01-16 DIAGNOSIS — N433 Hydrocele, unspecified: Secondary | ICD-10-CM | POA: Insufficient documentation

## 2022-01-16 DIAGNOSIS — Z7901 Long term (current) use of anticoagulants: Secondary | ICD-10-CM | POA: Insufficient documentation

## 2022-01-16 DIAGNOSIS — I861 Scrotal varices: Secondary | ICD-10-CM | POA: Insufficient documentation

## 2022-01-16 DIAGNOSIS — I10 Essential (primary) hypertension: Secondary | ICD-10-CM

## 2022-01-16 DIAGNOSIS — Z7982 Long term (current) use of aspirin: Secondary | ICD-10-CM | POA: Insufficient documentation

## 2022-01-16 DIAGNOSIS — F333 Major depressive disorder, recurrent, severe with psychotic symptoms: Secondary | ICD-10-CM | POA: Insufficient documentation

## 2022-01-16 DIAGNOSIS — R45851 Suicidal ideations: Secondary | ICD-10-CM | POA: Insufficient documentation

## 2022-01-16 LAB — COMPREHENSIVE METABOLIC PANEL
ALT: 29 U/L (ref 0–44)
AST: 45 U/L — ABNORMAL HIGH (ref 15–41)
Albumin: 3.7 g/dL (ref 3.5–5.0)
Alkaline Phosphatase: 97 U/L (ref 38–126)
Anion gap: 6 (ref 5–15)
BUN: 11 mg/dL (ref 6–20)
CO2: 23 mmol/L (ref 22–32)
Calcium: 8.9 mg/dL (ref 8.9–10.3)
Chloride: 107 mmol/L (ref 98–111)
Creatinine, Ser: 1.28 mg/dL — ABNORMAL HIGH (ref 0.61–1.24)
GFR, Estimated: >60 mL/min (ref >60)
Glucose, Bld: 96 mg/dL (ref 70–99)
Potassium: 3.7 mmol/L (ref 3.5–5.1)
Sodium: 136 mmol/L (ref 135–145)
Total Bilirubin: 0.5 mg/dL (ref 0.3–1.2)
Total Protein: 8 g/dL (ref 6.5–8.1)

## 2022-01-16 LAB — CBC
HCT: 38.6 % — ABNORMAL LOW (ref 39.0–52.0)
Hemoglobin: 12.6 g/dL — ABNORMAL LOW (ref 13.0–17.0)
MCH: 30.3 pg (ref 26.0–34.0)
MCHC: 32.6 g/dL (ref 30.0–36.0)
MCV: 92.8 fL (ref 80.0–100.0)
Platelets: 385 10*3/uL (ref 150–400)
RBC: 4.16 MIL/uL — ABNORMAL LOW (ref 4.22–5.81)
RDW: 16.1 % — ABNORMAL HIGH (ref 11.5–15.5)
WBC: 10.5 10*3/uL (ref 4.0–10.5)
nRBC: 0 % (ref 0.0–0.2)

## 2022-01-16 LAB — RAPID URINE DRUG SCREEN, HOSP PERFORMED
Amphetamines: NOT DETECTED
Barbiturates: NOT DETECTED
Benzodiazepines: NOT DETECTED
Cocaine: POSITIVE — AB
Opiates: NOT DETECTED
Tetrahydrocannabinol: POSITIVE — AB

## 2022-01-16 LAB — SALICYLATE LEVEL: Salicylate Lvl: <7 mg/dL — ABNORMAL LOW (ref 7.0–30.0)

## 2022-01-16 LAB — ETHANOL: Alcohol, Ethyl (B): <10 mg/dL (ref <10)

## 2022-01-16 LAB — ACETAMINOPHEN LEVEL: Acetaminophen (Tylenol), Serum: <10 ug/mL — ABNORMAL LOW (ref 10–30)

## 2022-01-16 MED ORDER — ASPIRIN EC 81 MG PO TBEC
81.0000 mg | DELAYED_RELEASE_TABLET | Freq: Every day | ORAL | Status: DC
  Administered 2022-01-16 – 2022-01-18 (×3): 81 mg via ORAL
  Filled 2022-01-16 (×3): qty 1

## 2022-01-16 MED ORDER — IBUPROFEN 400 MG PO TABS
600.0000 mg | ORAL_TABLET | Freq: Two times a day (BID) | ORAL | Status: DC
  Administered 2022-01-16 – 2022-01-18 (×3): 600 mg via ORAL
  Filled 2022-01-16 (×4): qty 1

## 2022-01-16 MED ORDER — AMLODIPINE BESYLATE 5 MG PO TABS
5.0000 mg | ORAL_TABLET | Freq: Every day | ORAL | Status: DC
  Administered 2022-01-16 – 2022-01-18 (×3): 5 mg via ORAL
  Filled 2022-01-16 (×3): qty 1

## 2022-01-16 MED ORDER — HYDROCHLOROTHIAZIDE 25 MG PO TABS
25.0000 mg | ORAL_TABLET | Freq: Every day | ORAL | Status: DC
  Administered 2022-01-16 – 2022-01-18 (×3): 25 mg via ORAL
  Filled 2022-01-16 (×3): qty 1

## 2022-01-16 MED ORDER — LORAZEPAM 1 MG PO TABS
1.0000 mg | ORAL_TABLET | Freq: Once | ORAL | Status: AC
  Administered 2022-01-16: 1 mg via ORAL
  Filled 2022-01-16: qty 1

## 2022-01-16 MED ORDER — ACETAMINOPHEN 325 MG PO TABS
650.0000 mg | ORAL_TABLET | Freq: Two times a day (BID) | ORAL | Status: DC
  Administered 2022-01-16 – 2022-01-18 (×2): 650 mg via ORAL
  Filled 2022-01-16 (×5): qty 2

## 2022-01-16 MED ORDER — METOPROLOL TARTRATE 25 MG PO TABS
25.0000 mg | ORAL_TABLET | Freq: Two times a day (BID) | ORAL | Status: DC
  Administered 2022-01-16 – 2022-01-18 (×4): 25 mg via ORAL
  Filled 2022-01-16 (×4): qty 1

## 2022-01-16 MED ORDER — FUROSEMIDE 20 MG PO TABS: 40.0000 mg | ORAL_TABLET | ORAL | Status: DC | PRN

## 2022-01-16 NOTE — ED Notes (Addendum)
Patient missing charger for ankle monitor. Allowed phone call to family to ask for charger to be brought to ED- this call will not count toward daily allowance. Per NS message, mother would like a call from Pt- unable to find her phone number at this time.  ? ?Pt feeling anxious. States ativan worked earlier. New meds ordered by MD- see MAR. ?

## 2022-01-16 NOTE — ED Provider Triage Note (Signed)
Emergency Medicine Provider Triage Evaluation Note ? ?Seth Mcdaniel , a 56 y.o. male  was evaluated in triage.  Pt complains of anxiety and SI.  Reports he has not been sleeping either.  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  Reports he has not done a good job of taking care of himself.  Denies AVH but is concerned because he is no longer sleeping and his mother has to stay on the phone with him each night to discuss his SI. ? ?Additionally, patient reports having a swollen scrotum for the past 6 months.  Says it seems to be getting bigger.  Endorses some associated dysuria. ? ?Additionally, patient reports he has had some melena over the past few days.  Endorses heavy alcohol use to try and cope with his caretaking and that he takes a lot of NSAIDs as well. ? ?History of hypertension, has not taken his medication today.  He is here with his sister. ? ?Review of Systems  ?Positive: As above ?Negative: Shortness of breath, chest pain ? ?Physical Exam  ?BP (!) 203/133   Pulse 89   Temp 98.6 ?F (37 ?C) (Oral)   Resp 20   SpO2 97%  ?Gen:   Awake, no distress   ?Resp:  Normal effort  ?MSK:   Moves extremities without difficulty  ?Other:  Scrotum the size of a cantaloupe.  Firm. ? ?Medical Decision Making  ?Medically screening exam initiated at 12:23 PM.  Appropriate orders placed.  Seth Mcdaniel was informed that the remainder of the evaluation will be completed by another provider, this initial triage assessment does not replace that evaluation, and the importance of remaining in the ED until their evaluation is complete. ? ? ?  ?Janashia Parco A, PA-C ?01/16/22 1225 ? ?

## 2022-01-16 NOTE — ED Notes (Signed)
Patient provided soda per request. ?

## 2022-01-16 NOTE — BH Assessment (Signed)
@   12:15 - Pt with elevated BP 203/133 - no H&P completed and labs pending.  Patient is not medically cleared for [facility] Assessment.  TTS to see at a later time.  ?

## 2022-01-16 NOTE — ED Triage Notes (Signed)
Patient coming from home complaint of depression and suicidal ideation for approx 1 month. Also endorses groin swelling. ?

## 2022-01-16 NOTE — Discharge Instructions (Addendum)
A prescription for your blood pressure medication was sent to the pharmacy.  Follow up with behavioral health to help you with your anxiety and depression.  You need a regular doctor as well to help you with your high blood pressure.  Social work should call you about this. ?

## 2022-01-16 NOTE — ED Notes (Signed)
PO officer called to inform about ankle bracelet. Unable to have cord in room with Pt. PO officer is aware and okay with this. ?

## 2022-01-16 NOTE — ED Provider Notes (Signed)
?[facility] ?Provider Note ? ? ?CSN: [id number] ?Arrival date & time: 01/16/22  1058 ? ?  ? ?History ? ?Chief Complaint  ?Patient presents with  ? Suicidal  ? Groin Swelling  ? ? ?Seth Mcdaniel is a 56 y.o. male. ? ?Pt complains of thoughts of suicide.  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  He has been her caregiver for 2 years.  Pt states he can not take care of himself.  Pt reports he has been hearing voices.  He reports feeling paranoid and can not sleep.  He reports he has a mass in his scrotum that is getting bigger.  Pt reports he noticed it 6 months ago but the size continues to increase,   ? ?The history is provided by the patient. No language interpreter was used.  ? ?  ? ?Home Medications ?Prior to Admission medications   ?Medication Sig Start Date End Date Taking? Authorizing Provider  ?acetaminophen (TYLENOL) 325 MG tablet Take 650 mg by mouth 2 (two) times daily.     [provider]  ?amLODipine (NORVASC) 5 MG tablet Take 1 tablet (5 mg total) by mouth daily. 06/01/19   Jeanmarie Hubert, MD  ?apixaban (ELIQUIS) 5 MG TABS tablet Take 1 tablet (5 mg total) by mouth 2 (two) times daily for 28 days. 10/21/18 11/18/18  Oretha Milch D, MD  ?aspirin EC 81 MG tablet Take 81 mg by mouth daily.    [provider]  ?bismuth subsalicylate (PEPTO BISMOL) 262 MG/15ML suspension Take 30 mLs by mouth every 6 (six) hours as needed for diarrhea or loose stools (stomach pain).    [provider]  ?furosemide (LASIX) 40 MG tablet Take 1 tablet (40 mg total) by mouth as needed (swelling in legs). 10/21/18   Desiree Hane, MD  ?hydrochlorothiazide (HYDRODIURIL) 25 MG tablet Take 1 tablet (25 mg total) by mouth daily. 06/01/19   Jeanmarie Hubert, MD  ?ibuprofen (ADVIL,MOTRIN) 200 MG tablet Take 600 mg by mouth 2 (two) times daily.     [provider]  ?metoprolol tartrate (LOPRESSOR) 25 MG tablet Take 1 tablet  (25 mg total) by mouth 2 (two) times daily. 10/21/18   Desiree Hane, MD  ?naproxen (NAPROSYN) 500 MG tablet Take 1 tablet (500 mg total) by mouth 2 (two) times daily with a meal. 07/09/19   Charlott Rakes, MD  ?ranitidine (ZANTAC) 150 MG tablet Take 1 tablet (150 mg total) by mouth 2 (two) times daily. ?Patient not taking: Reported on 10/18/2018 06/13/17   [address], [place], [place]  ?   ? ?Allergies    ?Penicillins   ? ?Review of Systems   ?Review of Systems  ?All other systems reviewed and are negative. ? ?Physical Exam ?Updated Vital Signs ?BP (!) 203/133   Pulse 89   Temp 98.6 ?F (37 ?C) (Oral)   Resp 20   SpO2 97%  ?Physical Exam ?Vitals reviewed.  ?Constitutional:   ?   Appearance: Normal appearance.  ?HENT:  ?   Mouth/Throat:  ?   Mouth: Mucous membranes are moist.  ?Eyes:  ?   Pupils: Pupils are equal, round, and reactive to light.  ?Cardiovascular:  ?   Rate and Rhythm: Normal rate and regular rhythm.  ?Pulmonary:  ?   Effort: Pulmonary effort is normal.  ?   Breath sounds: Normal breath sounds.  ?Abdominal:  ?   General: Abdomen is flat.  ?Musculoskeletal:     ?  General: Normal range of motion.  ?   Cervical back: Normal range of motion.  ?Skin: ?   General: Skin is warm.  ?Neurological:  ?   General: No focal deficit present.  ?   Mental Status: He is alert.  ?Psychiatric:  ?   Comments: Depressed,   ? ? ?ED Results / Procedures / Treatments   ?Labs ?(all labs ordered are listed, but only abnormal results are displayed) ?Labs Reviewed  ?COMPREHENSIVE METABOLIC PANEL - Abnormal; Notable for the following components:  ?    Result Value  ? Creatinine, Ser 1.28 (*)   ? AST 45 (*)   ? All other components within normal limits  ?SALICYLATE LEVEL - Abnormal; Notable for the following components:  ? Salicylate Lvl <0.7 (*)   ? All other components within normal limits  ?ACETAMINOPHEN LEVEL - Abnormal; Notable for the following components:  ? Acetaminophen (Tylenol), Serum <10 (*)   ? All other components within  normal limits  ?CBC - Abnormal; Notable for the following components:  ? RBC 4.16 (*)   ? Hemoglobin 12.6 (*)   ? HCT 38.6 (*)   ? RDW 16.1 (*)   ? All other components within normal limits  ?RAPID URINE DRUG SCREEN, HOSP PERFORMED - Abnormal; Notable for the following components:  ? Cocaine POSITIVE (*)   ? Tetrahydrocannabinol POSITIVE (*)   ? All other components within normal limits  ?ETHANOL  ?POC OCCULT BLOOD, ED  ? ? ?EKG ?None ? ?Radiology ?No results found. ? ?Procedures ?Procedures  ? ? ?Medications Ordered in ED ?Medications  ?LORazepam (ATIVAN) tablet 1 mg (1 mg Oral Given 01/16/22 1249)  ? ? ?ED Course/ Medical Decision Making/ A&P ?  ?                        ?Medical Decision Making ?Amount and/or Complexity of Data Reviewed ?Labs: ordered. ? ? ?MDM:  Ultrasound scrotum pending  TTS will be consulted if ultrasound is okay.  Pt will most likely need urology referral ? ? ? ? ? ? ? ?Final Clinical Impression(s) / ED Diagnoses ?Final diagnoses:  ?Suicidal ideation  ?Hydrocele, unspecified hydrocele type  ?Varicocele  ? ? ?Rx / DC Orders ?ED Discharge Orders   ? ? None  ? ?  ? ? ?  ?Fransico Meadow, [place] ?01/16/22 1545 ? ?  ?Godfrey Pick, MD ?01/18/22 1707 ? ?

## 2022-01-17 MED ORDER — LORAZEPAM 1 MG PO TABS
1.0000 mg | ORAL_TABLET | Freq: Once | ORAL | Status: AC
  Administered 2022-01-17: 1 mg via ORAL
  Filled 2022-01-17: qty 1

## 2022-01-17 MED ORDER — HALOPERIDOL 5 MG PO TABS
5.0000 mg | ORAL_TABLET | Freq: Once | ORAL | Status: AC
  Administered 2022-01-17: 5 mg via ORAL
  Filled 2022-01-17: qty 1

## 2022-01-17 MED ORDER — HYDROXYZINE HCL 25 MG PO TABS
25.0000 mg | ORAL_TABLET | Freq: Four times a day (QID) | ORAL | Status: DC | PRN
  Administered 2022-01-17 – 2022-01-18 (×3): 50 mg via ORAL
  Filled 2022-01-17 (×3): qty 2

## 2022-01-17 NOTE — ED Notes (Signed)
PT agitated and hostile and threating to go off if he does not get med for agitation.[employer] Probation officer spoke with DR Jeanell Sparrow RDP for ativan that Pt received on admission. Pt was also given his 10AM BP meds because of elevated BP. DR Jeanell Sparrow EDP aware of PT elevated BP. Pt reports he had not taken BP meds 2 weeks prior to ADM to ED. ?

## 2022-01-17 NOTE — ED Notes (Signed)
Breakfast order placed ?

## 2022-01-17 NOTE — ED Notes (Signed)
Patient made phone call ?

## 2022-01-17 NOTE — ED Notes (Signed)
Introduced self to patient, patient denies SI or HI. States he is feeling very anxious despite being medicated earlier.  ?

## 2022-01-17 NOTE — ED Notes (Signed)
TTS in process 

## 2022-01-17 NOTE — ED Notes (Signed)
Patient's mother here to visit. Patient calm and cooperative. ?
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
## 2022-01-17 NOTE — ED Notes (Addendum)
Patient states he wants to check out, then says he doesn't. States his mind is playing tricks on him. MD notified. ?

## 2022-01-17 NOTE — ED Notes (Signed)
Patient continues to c/o anxiety. Informed he could have another dose of Atarax at 1900. ?

## 2022-01-18 MED ORDER — HYDROCHLOROTHIAZIDE 25 MG PO TABS: 25.0000 mg | ORAL_TABLET | Freq: Every day | ORAL | 2 refills | Status: DC

## 2022-01-18 MED ORDER — AMLODIPINE BESYLATE 5 MG PO TABS: 5.0000 mg | ORAL_TABLET | Freq: Every day | ORAL | 2 refills | Status: DC

## 2022-01-18 MED ORDER — HYDROXYZINE HCL 25 MG PO TABS: 25.0000 mg | ORAL_TABLET | Freq: Four times a day (QID) | ORAL | 0 refills | Status: AC | PRN

## 2022-01-18 MED ORDER — METOPROLOL TARTRATE 25 MG PO TABS: 100.0000 mg | ORAL_TABLET | Freq: Two times a day (BID) | ORAL | 2 refills | Status: DC

## 2022-01-18 MED ORDER — LORAZEPAM 1 MG PO TABS
2.0000 mg | ORAL_TABLET | Freq: Once | ORAL | Status: AC
  Administered 2022-01-18: 2 mg via ORAL
  Filled 2022-01-18: qty 2

## 2022-01-18 NOTE — ED Notes (Signed)
Pt requesting to go home, states he is ready and does not need to be here anymore, needs to go home and take care of his wife. Advised pt I would again let the provider know. ?

## 2022-01-18 NOTE — Discharge Planning (Signed)
Pt met with pt prior to discharge to discuss upcoming PCP appointment and verify that he could pick up Rx at pharmacy.  Pt verbalizes intent to keep appointment and pick up Rx when he leaves hospital today. ?

## 2022-01-18 NOTE — ED Provider Notes (Addendum)
Emergency Medicine Observation Re-evaluation Note ? ?Seth Mcdaniel is a 56 y.o. male, seen on rounds today.  Pt initially presented to the ED for complaints of Suicidal and Groin Swelling ?Currently, the patient is sitting in the corner of the rooom and appears tense. ? ?Physical Exam  ?BP (!) 177/123 (BP Location: Left Arm)   Pulse 63   Temp 97.9 ?F (36.6 ?C) (Oral)   Resp 17   SpO2 97%  ?Physical Exam ?General: awake and alert ?Cardiac: regular rate ?Lungs: cleart ?Psych: anxious ? ?ED Course / MDM  ?EKG:  ? ?I have reviewed the labs performed to date as well as medications administered while in observation.  Recent changes in the last 24 hours include pt required ativan last night for anxiety.  Continues to be hypertensive. ? ?Plan  ?Current plan is for inpt admission.  Pt has been placed on home bp meds.  Will continue to follow bp after pt recieves meds does improve.  Low salt diet. ? Param Cordai Rodrigue is not under involuntary commitment. ? ? ?9:48 AM ?Patient is requesting to go home.  He has now been here for 2 days.  Initially upon patient's arrival he was complaining of suicidal thoughts but had no plan.  He was occasionally feeling like he was hallucinating and seeing ghosts.  He has been the sole provider for his fianc?e who is chronically ill with trach and PEG tubes.  Patient has never had mental health evaluation in the past.  Today patient is awake and alert.  He is calm and appears to be making rational decisions.  He reports that he would really like to go home.  He is missing his significant other and knows that she needs him.  He reports that he did admit to having some suicidal thoughts when he initially came in but after he has had a few days to think about it he knows that he would never hurt himself because she needs him.  He reports that he just needed a few days to get his head on right.  He is interested in following up at behavioral health urgent care as an outpatient  but does not want inpatient therapy or treatment at this time.  He does report that he has some help at home with her and encouraged him to take some time for himself.  He has not taken his blood pressure medication in the last 2 weeks and is not sure that he has much at home.  He was given refills for this.  Discussed with him that we are not able to give him any medication for anxiety and depression here but that is why he should follow-up with mental health resources and counseling.  Patient does not appear to be a threat to himself or others at this time.  He is not under IVC.  Feel that he is stable for discharge at this time. ?  Blanchie Dessert, MD ?01/18/22 [phone] ? ?  ?Blanchie Dessert, MD ?01/18/22 [phone] ? ?

## 2022-01-18 NOTE — ED Notes (Signed)
Pt repeatedly calling this nurse to room and asking for something for his anxiety, states he got ativan last night and it really helped. Pt asking about leaving and the process and then changing his mind saying that he wants to stay. ?

## 2022-01-18 NOTE — ED Notes (Signed)
Pt given back belongings. ?

## 2022-02-09 ENCOUNTER — Encounter (INDEPENDENT_AMBULATORY_CARE_PROVIDER_SITE_OTHER): Payer: Self-pay | Admitting: Primary Care

## 2022-02-09 ENCOUNTER — Ambulatory Visit (INDEPENDENT_AMBULATORY_CARE_PROVIDER_SITE_OTHER): Payer: Self-pay | Admitting: Primary Care

## 2022-02-09 VITALS — BP 152/116 | HR 92 | Temp 98.0°F | Ht 75.0 in | Wt 215.6 lb

## 2022-02-09 DIAGNOSIS — I10 Essential (primary) hypertension: Secondary | ICD-10-CM

## 2022-02-09 DIAGNOSIS — Z7689 Persons encountering health services in other specified circumstances: Secondary | ICD-10-CM

## 2022-02-09 DIAGNOSIS — I480 Paroxysmal atrial fibrillation: Secondary | ICD-10-CM

## 2022-02-09 MED ORDER — HYDROCHLOROTHIAZIDE 25 MG PO TABS: 25.0000 mg | ORAL_TABLET | Freq: Every day | ORAL | 1 refills | Status: AC

## 2022-02-09 MED ORDER — METOPROLOL TARTRATE 25 MG PO TABS: 25.0000 mg | ORAL_TABLET | Freq: Two times a day (BID) | ORAL | 1 refills | Status: AC

## 2022-02-09 MED ORDER — AMLODIPINE BESYLATE 10 MG PO TABS: 10.0000 mg | ORAL_TABLET | Freq: Every day | ORAL | 1 refills | Status: AC

## 2022-02-09 MED ORDER — VALSARTAN 40 MG PO TABS: 40.0000 mg | ORAL_TABLET | Freq: Every day | ORAL | 1 refills | Status: AC

## 2022-02-09 NOTE — Progress Notes (Signed)
?[place] ? ? ?Subjective:  ? Seth Mcdaniel is a 56 y.o. male presents for hospital follow up and establish care. Admit date to the hospital was 01/16/22, patient was discharged from the hospital on 01/18/22, patient was admitted for: behavioral issues. Bp is extremely. Elevated. Patient has No headache, No chest pain, No abdominal pain - No Nausea, No new weakness tingling or numbness, No Cough - shortness of breath ? ?  ?Past Medical History:  ?Diagnosis Date  ? Hypertension   ?  ? ?Allergies  ?Allergen Reactions  ? Penicillins Hives  ?  Has patient had a PCN reaction causing immediate rash, facial/tongue/throat swelling, SOB or lightheadedness with hypotension: Yes ?Has patient had a PCN reaction causing severe rash involving mucus membranes or skin necrosis: No ?Has patient had a PCN reaction that required hospitalization No ?Has patient had a PCN reaction occurring within the last 10 years: No ?If all of the above answers are "NO", then may proceed with Cephalosporin use. ?  ? ? ?Review of System: ?Comprehensive ROS Pertinent positive and negative noted in HPI   ? ?Objective:  ?BP (!) 152/116 (BP Location: Left Arm, Patient Position: Sitting, Cuff Size: Large)   Pulse 92   Temp 98 ?F (36.7 ?C) (Oral)   Ht '6\' 3"'$  (1.905 m)   Wt 215 lb 9.6 oz (97.8 kg)   SpO2 99%   BMI 26.95 kg/m?  ? ?Filed Weights  ? 02/09/22 1425  ?Weight: 215 lb 9.6 oz (97.8 kg)  ? ? ?Physical Exam: ?General Appearance: Well nourished, in no apparent distress. ?Eyes: PERRLA, EOMs, conjunctiva no swelling or erythema ?Sinuses: No Frontal/maxillary tenderness ?ENT/Mouth: Ext aud canals clear, TMs without erythema, bulging. Hearing normal.  ?Neck: Supple, thyroid normal.  ?Respiratory: Respiratory effort normal, BS equal bilaterally without rales, rhonchi, wheezing or stridor.  ?Cardio: RRR with no MRGs. Brisk peripheral pulses without edema.  ?Abdomen: Soft, + BS.  Non tender, no guarding, rebound, hernias,  masses. ?Lymphatics: Non tender without lymphadenopathy.  ?Musculoskeletal: Full ROM, 5/5 strength, normal gait.  ?Skin: Warm, dry without rashes, lesions, ecchymosis.  ?Neuro: Cranial nerves intact. Normal muscle tone, no cerebellar symptoms. Sensation intact.  ?Psych: Awake and oriented X 3, normal affect, Insight and Judgment appropriate.  ? ? ?Assessment:  ?Seth Mcdaniel was seen today for hospitalization follow-up. ? ?Diagnoses and all orders for this visit: ? ?Essential hypertension ?Counseled on blood pressure goal of less than 130/80, low-sodium, DASH diet, medication compliance, 150 minutes of moderate intensity exercise per week. ?Discussed medication compliance, adverse effects.  ?-     amLODipine (NORVASC) 10 MG tablet; Take 1 tablet (10 mg total) by mouth daily. ?-     hydrochlorothiazide (HYDRODIURIL) 25 MG tablet; Take 1 tablet (25 mg total) by mouth daily. ?-     . ?-     valsartan (DIOVAN) 40 MG tablet; Take 1 tablet (40 mg total) by mouth daily. ? ?Encounter to establish care ?Establish care with new PCP ? ?Paroxysmal atrial fibrillation (HCC) ?metoprolol tartrate (LOPRESSOR) 25 MG tablet; Take 1 tablet (25 mg total) by mouth 2 (two) times daily ?  ? ?Meds ordered this encounter  ?Medications  ? amLODipine (NORVASC) 10 MG tablet  ?  Sig: Take 1 tablet (10 mg total) by mouth daily.  ?  Dispense:  90 tablet  ?  Refill:  1  ? hydrochlorothiazide (HYDRODIURIL) 25 MG tablet  ?  Sig: Take 1 tablet (25 mg total) by mouth daily.  ?  Dispense:  90 tablet  ?  Refill:  1  ? metoprolol tartrate (LOPRESSOR) 25 MG tablet  ?  Sig: Take 1 tablet (25 mg total) by mouth 2 (two) times daily.  ?  Dispense:  180 tablet  ?  Refill:  1  ? valsartan (DIOVAN) 40 MG tablet  ?  Sig: Take 1 tablet (40 mg total) by mouth daily.  ?  Dispense:  90 tablet  ?  Refill:  1  ? ? ?This note has been created with Surveyor, quantity. Any transcriptional errors are unintentional.  ? ?Kerin Perna, NP ?02/15/2022, 7:44 PM ?  ? ?

## 2022-02-09 NOTE — Patient Instructions (Signed)
Hypertension, Adult High blood pressure (hypertension) is when the force of blood pumping through the arteries is too strong. The arteries are the blood vessels that carry blood from the heart throughout the body. Hypertension forces the heart to work harder to pump blood and may cause arteries to become narrow or stiff. Untreated or uncontrolled hypertension can lead to a heart attack, heart failure, a stroke, kidney disease, and other problems. A blood pressure reading consists of a higher number over a lower number. Ideally, your blood pressure should be below 120/80. The first ("top") number is called the systolic pressure. It is a measure of the pressure in your arteries as your heart beats. The second ("bottom") number is called the diastolic pressure. It is a measure of the pressure in your arteries as the heart relaxes. What are the causes? The exact cause of this condition is not known. There are some conditions that result in high blood pressure. What increases the risk? Certain factors may make you more likely to develop high blood pressure. Some of these risk factors are under your control, including: Smoking. Not getting enough exercise or physical activity. Being overweight. Having too much fat, sugar, calories, or salt (sodium) in your diet. Drinking too much alcohol. Other risk factors include: Having a personal history of heart disease, diabetes, high cholesterol, or kidney disease. Stress. Having a family history of high blood pressure and high cholesterol. Having obstructive sleep apnea. Age. The risk increases with age. What are the signs or symptoms? High blood pressure may not cause symptoms. Very high blood pressure (hypertensive crisis) may cause: Headache. Fast or irregular heartbeats (palpitations). Shortness of breath. Nosebleed. Nausea and vomiting. Vision changes. Severe chest pain, dizziness, and seizures. How is this diagnosed? This condition is diagnosed by  measuring your blood pressure while you are seated, with your arm resting on a flat surface, your legs uncrossed, and your feet flat on the floor. The cuff of the blood pressure monitor will be placed directly against the skin of your upper arm at the level of your heart. Blood pressure should be measured at least twice using the same arm. Certain conditions can cause a difference in blood pressure between your right and left arms. If you have a high blood pressure reading during one visit or you have normal blood pressure with other risk factors, you may be asked to: Return on a different day to have your blood pressure checked again. Monitor your blood pressure at home for 1 week or longer. If you are diagnosed with hypertension, you may have other blood or imaging tests to help your health care provider understand your overall risk for other conditions. How is this treated? This condition is treated by making healthy lifestyle changes, such as eating healthy foods, exercising more, and reducing your alcohol intake. You may be referred for counseling on a healthy diet and physical activity. Your health care provider may prescribe medicine if lifestyle changes are not enough to get your blood pressure under control and if: Your systolic blood pressure is above 130. Your diastolic blood pressure is above 80. Your personal target blood pressure may vary depending on your medical conditions, your age, and other factors. Follow these instructions at home: Eating and drinking  Eat a diet that is high in fiber and potassium, and low in sodium, added sugar, and fat. An example of this eating plan is called the DASH diet. DASH stands for Dietary Approaches to Stop Hypertension. To eat this way: Eat   plenty of fresh fruits and vegetables. Try to fill one half of your plate at each meal with fruits and vegetables. Eat whole grains, such as whole-wheat pasta, brown rice, or whole-grain bread. Fill about one  fourth of your plate with whole grains. Eat or drink low-fat dairy products, such as skim milk or low-fat yogurt. Avoid fatty cuts of meat, processed or cured meats, and poultry with skin. Fill about one fourth of your plate with lean proteins, such as fish, chicken without skin, beans, eggs, or tofu. Avoid pre-made and processed foods. These tend to be higher in sodium, added sugar, and fat. Reduce your daily sodium intake. Many people with hypertension should eat less than 1,500 mg of sodium a day. Do not drink alcohol if: Your health care provider tells you not to drink. You are pregnant, may be pregnant, or are planning to become pregnant. If you drink alcohol: Limit how much you have to: 0-1 drink a day for women. 0-2 drinks a day for men. Know how much alcohol is in your drink. In the [place] one drink equals one 12 oz bottle of beer (355 mL), one 5 oz glass of wine (148 mL), or one 1 oz glass of hard liquor (44 mL). Lifestyle  Work with your health care provider to maintain a healthy body weight or to lose weight. Ask what an ideal weight is for you. Get at least 30 minutes of exercise that causes your heart to beat faster (aerobic exercise) most days of the week. Activities may include walking, swimming, or biking. Include exercise to strengthen your muscles (resistance exercise), such as Pilates or lifting weights, as part of your weekly exercise routine. Try to do these types of exercises for 30 minutes at least 3 days a week. Do not use any products that contain nicotine or tobacco. These products include cigarettes, chewing tobacco, and vaping devices, such as e-cigarettes. If you need help quitting, ask your health care provider. Monitor your blood pressure at home as told by your health care provider. Keep all follow-up visits. This is important. Medicines Take over-the-counter and prescription medicines only as told by your health care provider. Follow directions carefully. Blood  pressure medicines must be taken as prescribed. Do not skip doses of blood pressure medicine. Doing this puts you at risk for problems and can make the medicine less effective. Ask your health care provider about side effects or reactions to medicines that you should watch for. Contact a health care provider if you: Think you are having a reaction to a medicine you are taking. Have headaches that keep coming back (recurring). Feel dizzy. Have swelling in your ankles. Have trouble with your vision. Get help right away if you: Develop a severe headache or confusion. Have unusual weakness or numbness. Feel faint. Have severe pain in your chest or abdomen. Vomit repeatedly. Have trouble breathing. These symptoms may be an emergency. Get help right away. Call 911. Do not wait to see if the symptoms will go away. Do not drive yourself to the hospital. Summary Hypertension is when the force of blood pumping through your arteries is too strong. If this condition is not controlled, it may put you at risk for serious complications. Your personal target blood pressure may vary depending on your medical conditions, your age, and other factors. For most people, a normal blood pressure is less than 120/80. Hypertension is treated with lifestyle changes, medicines, or a combination of both. Lifestyle changes include losing weight, eating a healthy,   low-sodium diet, exercising more, and limiting alcohol. This information is not intended to replace advice given to you by your health care provider. Make sure you discuss any questions you have with your health care provider. Document Revised: 08/02/2021 Document Reviewed: 08/02/2021 [employer] Patient Education  2023 [employer]  

## 2022-03-09 ENCOUNTER — Ambulatory Visit (INDEPENDENT_AMBULATORY_CARE_PROVIDER_SITE_OTHER): Payer: Self-pay | Admitting: Primary Care

## 2022-09-27 IMAGING — US US SCROTUM W/ DOPPLER COMPLETE
2 series · 13 of 25 positions shown · non-contrast
Comparison: CT [facility], 06/14/2019.

CLINICAL DATA: Swelling.

EXAM:
SCROTAL ULTRASOUND
DOPPLER ULTRASOUND OF THE TESTICLES
TECHNIQUE: Complete ultrasound examination of the testicles, epididymis, and
other scrotal structures was performed. Color and spectral Doppler
ultrasound were also utilized to evaluate blood flow to the
testicles.

[Series 1: us scrotum w/doppler · 36 acquisitions, 12 frames shown]
[im 1/36]
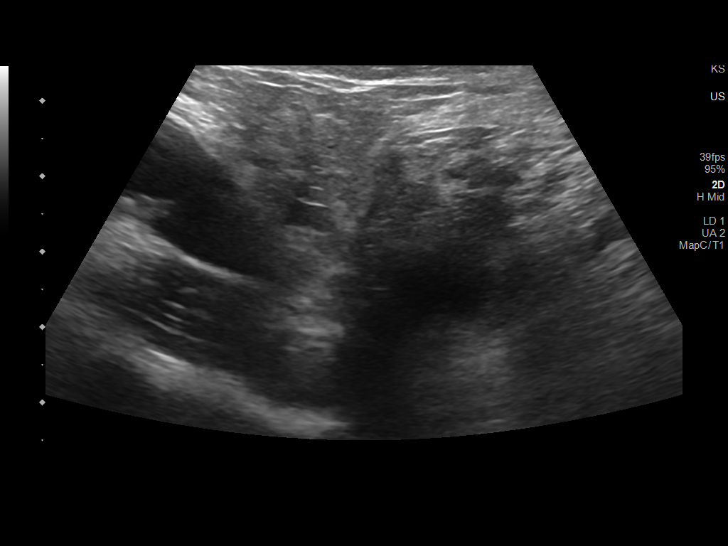
[im 4/36]
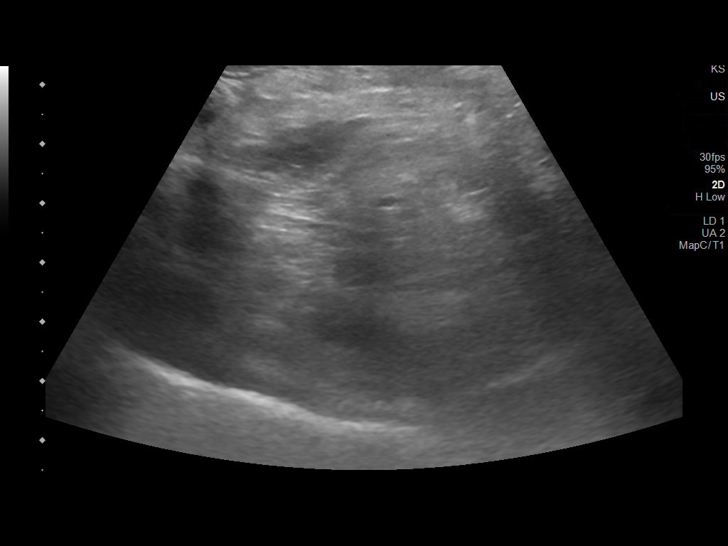
[im 7/36]
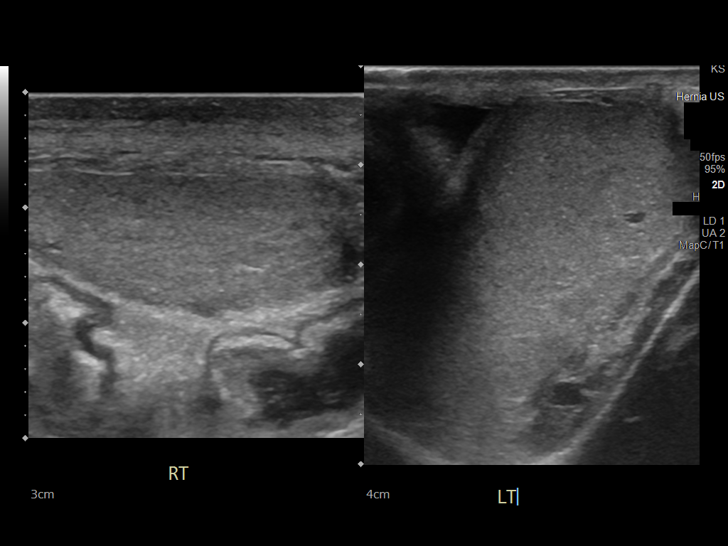
[im 10/36]
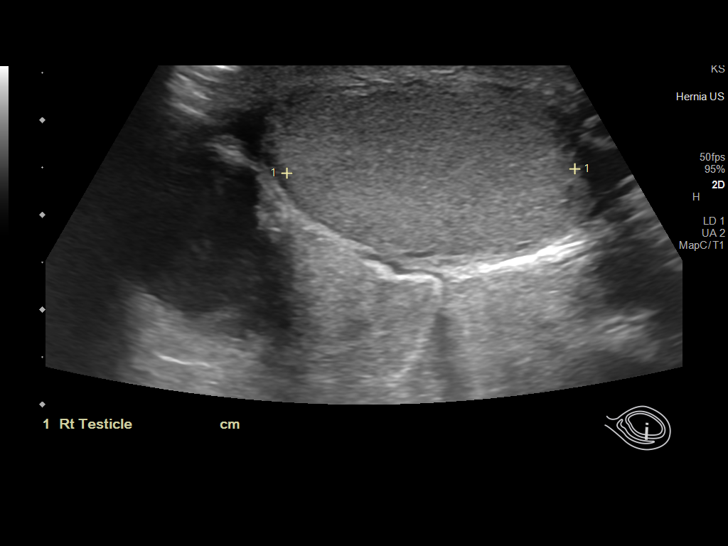
[im 13/36]
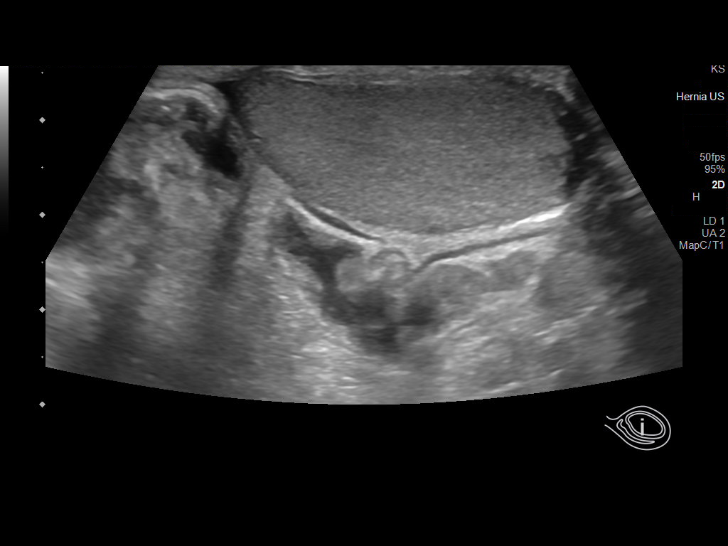
[im 16/36]
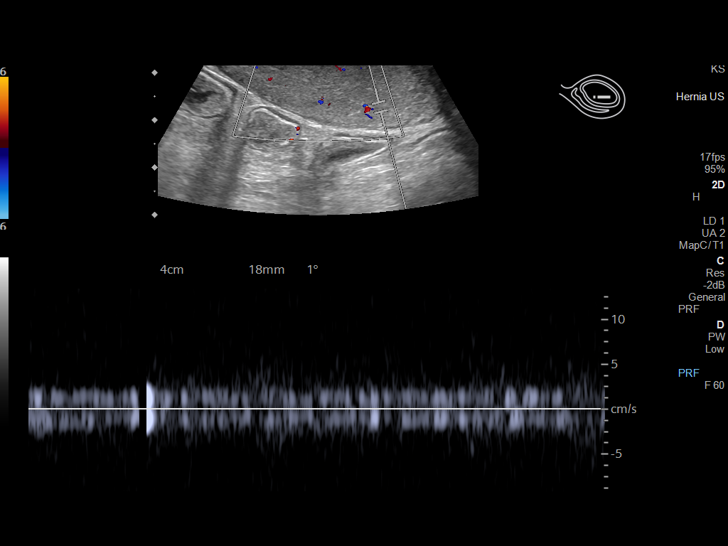
[im 19/36]
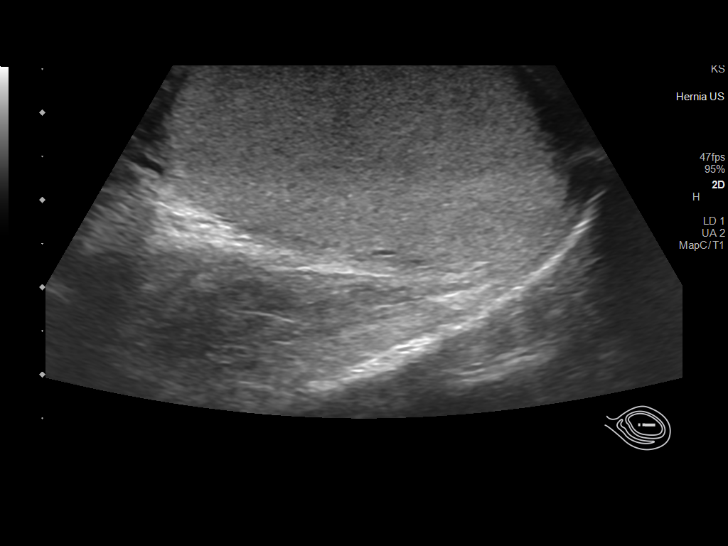
[im 22/36]
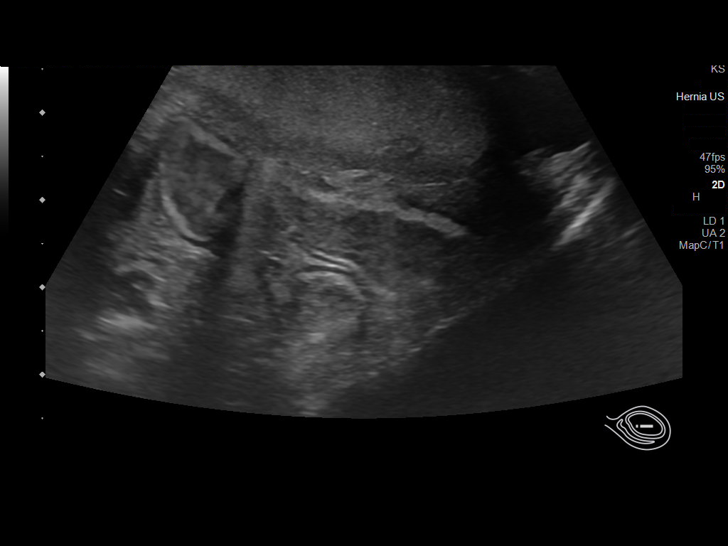
[im 25/36]
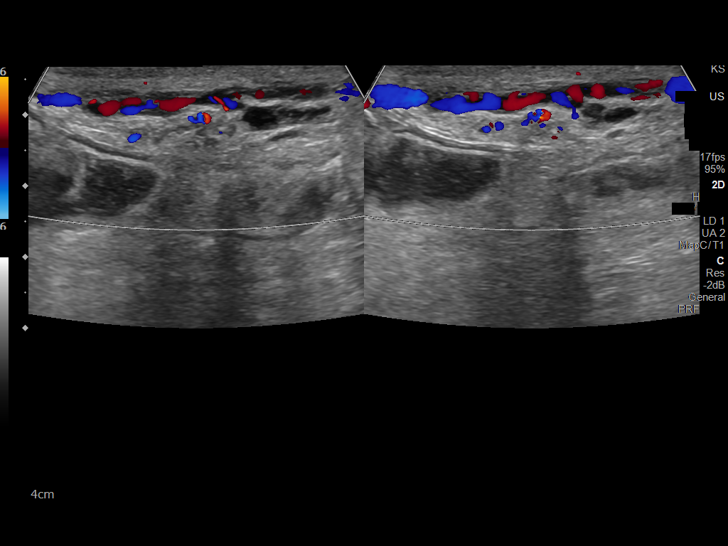
[im 28/36]
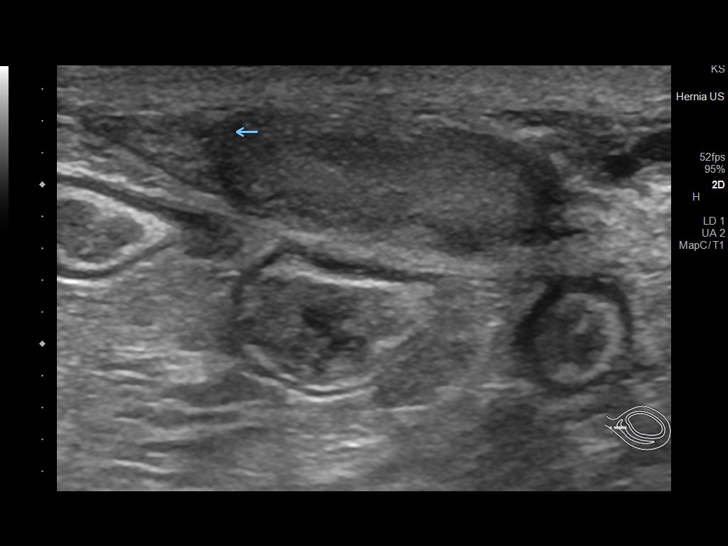
[im 31/36]
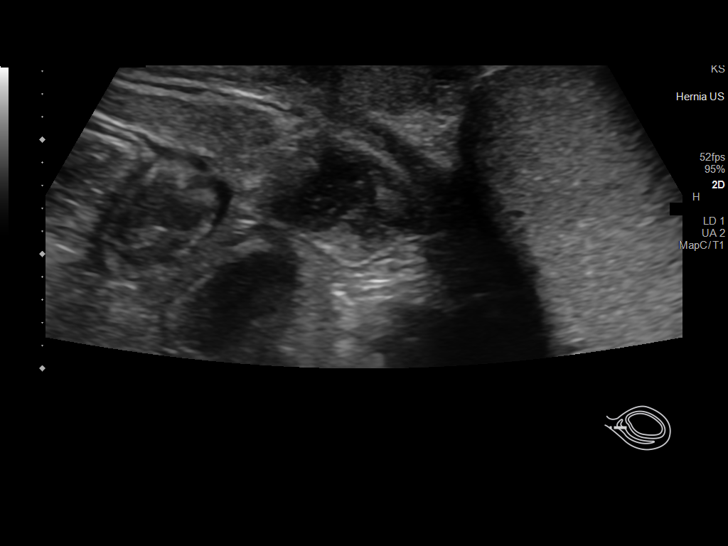
[im 34/36]
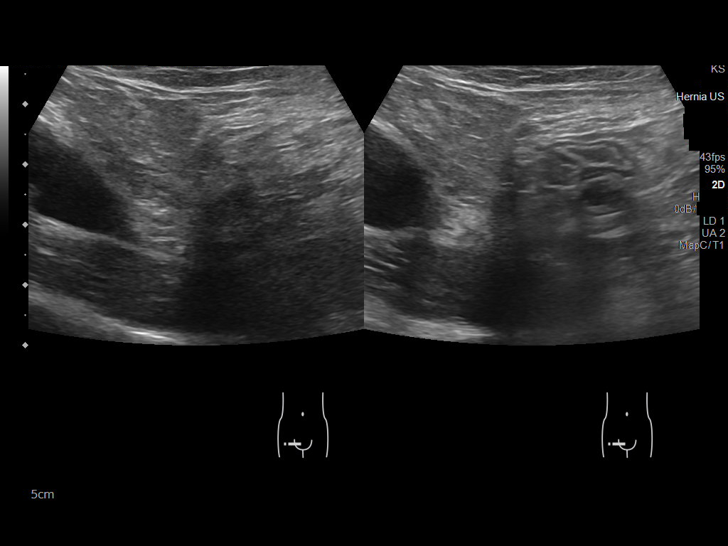

[Series 1001: testis us · 1 of 1 slices shown]
[im 1/1]
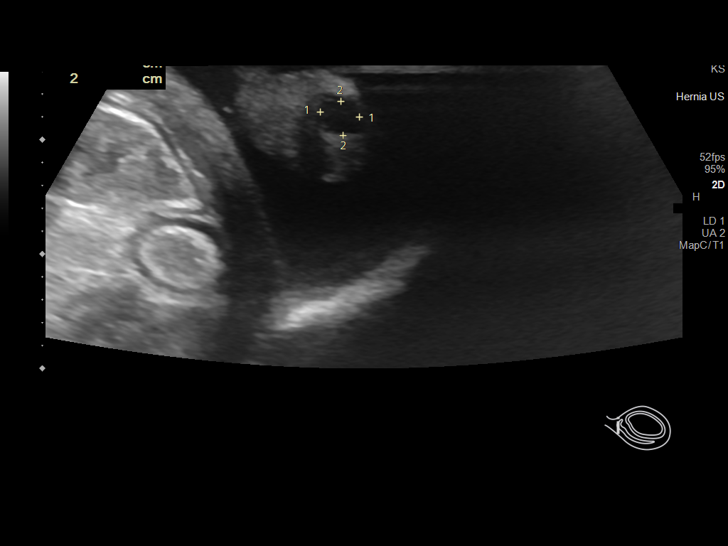

[13 of 25 positions shown; findings below may reference images not displayed]

FINDINGS: Right testicle

Measurements: 5.1 x 1.4 x 3.0 cm. No mass or microlithiasis
visualized.

Left testicle

Measurements: 4.6 x 2.3 x 2.9 cm. No mass or microlithiasis
visualized.

Right epididymis:  Normal in size and appearance.

Left epididymis: A small epididymal head cyst is present. Otherwise
normal in size and appearance.

Hydrocele:  A LEFT hydrocele is present.

Varicocele:  Small bilateral varicoceles are present.

Pulsed Doppler interrogation of both testes demonstrates normal low
resistance arterial and venous waveforms bilaterally.

On Valsalva, a RIGHT inguinal hernia is demonstrated. See key image.
IMPRESSION: 1. No sonographic or Doppler evidence of testicular malperfusion.
2. A RIGHT inguinal hernia is present.
3. LEFT hydrocele and bilateral varicoceles are present.
